# Patient Record
Sex: Female | Born: 2000 | Race: White | Hispanic: No | Marital: Single | Smoking: Never smoker
Health system: Southern US, Community
[De-identification: ages and names within clinical notes are randomized; demographics above are authoritative.]

## PROBLEM LIST (undated history)

## (undated) DIAGNOSIS — J351 Hypertrophy of tonsils: Secondary | ICD-10-CM

## (undated) DIAGNOSIS — B070 Plantar wart: Secondary | ICD-10-CM

## (undated) DIAGNOSIS — R51 Headache: Secondary | ICD-10-CM

## (undated) HISTORY — DX: Hypertrophy of tonsils: J35.1

## (undated) HISTORY — DX: Headache: R51

## (undated) HISTORY — DX: Plantar wart: B07.0

---
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
## 2019-01-04 DIAGNOSIS — L700 Acne vulgaris: Secondary | ICD-10-CM | POA: Diagnosis not present

## 2019-01-04 DIAGNOSIS — Z79899 Other long term (current) drug therapy: Secondary | ICD-10-CM | POA: Diagnosis not present

## 2019-02-02 DIAGNOSIS — Z79899 Other long term (current) drug therapy: Secondary | ICD-10-CM | POA: Diagnosis not present

## 2019-02-02 DIAGNOSIS — L700 Acne vulgaris: Secondary | ICD-10-CM | POA: Diagnosis not present

## 2019-03-03 DIAGNOSIS — L700 Acne vulgaris: Secondary | ICD-10-CM | POA: Diagnosis not present

## 2019-03-03 DIAGNOSIS — Z79899 Other long term (current) drug therapy: Secondary | ICD-10-CM | POA: Diagnosis not present

## 2019-04-01 DIAGNOSIS — Z79899 Other long term (current) drug therapy: Secondary | ICD-10-CM | POA: Diagnosis not present

## 2019-04-01 DIAGNOSIS — L700 Acne vulgaris: Secondary | ICD-10-CM | POA: Diagnosis not present

## 2019-04-27 ENCOUNTER — Other Ambulatory Visit: Payer: Self-pay

## 2019-04-27 ENCOUNTER — Ambulatory Visit (INDEPENDENT_AMBULATORY_CARE_PROVIDER_SITE_OTHER): Payer: Medicaid Other | Admitting: Family Medicine

## 2019-04-27 ENCOUNTER — Encounter: Payer: Self-pay | Admitting: Family Medicine

## 2019-04-27 VITALS — BP 112/72 | HR 84

## 2019-04-27 DIAGNOSIS — Z021 Encounter for pre-employment examination: Secondary | ICD-10-CM | POA: Diagnosis not present

## 2019-04-27 NOTE — Progress Notes (Signed)
Subjective  Jasmine Kennedy is a 18 y.o. 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   No chronic illnesses other than acne on accutane from [facility] Walks for exercise and stress reduction Try to watch diet  No smoking or illicit drugs Not sex active Fhx - Type 2 diabetes   Chief Complaint noted Review of Symptoms - see HPI PMH - Smoking status noted.    Objective Vital Signs reviewed BP 112/72   Pulse 84   LMP 04/25/2019   SpO2 99%  Psych:  Cognition and judgment appear intact. Alert, communicative  and cooperative with normal attention span and concentration. No apparent delusions, illusions, hallucinations Heart - Regular rate and rhythm.  No murmurs, gallops or rubs.    Lungs:  Normal respiratory effort, chest expands symmetrically. Lungs are clear to auscultation, no crackles or wheezes. Extremities:  No cyanosis, edema, or deformity noted with good range of motion of all major joints.   Neck:  No deformities, thyromegaly, masses, or tenderness noted.   Supple with full range of motion without pain.  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

## 2019-05-04 DIAGNOSIS — Z79899 Other long term (current) drug therapy: Secondary | ICD-10-CM | POA: Diagnosis not present

## 2019-05-04 DIAGNOSIS — L700 Acne vulgaris: Secondary | ICD-10-CM | POA: Diagnosis not present

## 2019-05-25 ENCOUNTER — Encounter: Payer: Self-pay | Admitting: Family Medicine

## 2019-05-25 ENCOUNTER — Ambulatory Visit (INDEPENDENT_AMBULATORY_CARE_PROVIDER_SITE_OTHER): Payer: Medicaid Other | Admitting: Family Medicine

## 2019-05-25 ENCOUNTER — Other Ambulatory Visit: Payer: Self-pay

## 2019-05-25 VITALS — BP 118/76 | HR 106

## 2019-05-25 DIAGNOSIS — Z309 Encounter for contraceptive management, unspecified: Secondary | ICD-10-CM

## 2019-05-25 DIAGNOSIS — L700 Acne vulgaris: Secondary | ICD-10-CM | POA: Diagnosis not present

## 2019-05-25 LAB — POCT URINE PREGNANCY: Preg Test, Ur: NEGATIVE

## 2019-05-25 MED ORDER — LEVONORGEST-ETH ESTRAD 91-DAY 0.15-0.03 MG PO TABS: 1.0000 | ORAL_TABLET | Freq: Every day | ORAL | 4 refills | Status: DC

## 2019-05-25 NOTE — Progress Notes (Signed)
   Subjective:    Jasmine Kennedy - 18 y.o. female MRN [id number]  Date of birth: Sep 14, 2001  CC:  Jasmine Kennedy is here for possibly starting oral contraceptives for acne treatment.  HPI: Ms. Kumagai currently takes Accutane for her acne, which she says has caused an improvement in her acne, although she still continues to have a few breakouts on her cheeks.  She thinks that these may also be due to having to wear a mask frequently.  She notes that she will often get new breakouts around her menstrual period.  She is interested in starting combined oral contraceptives to help control her acne.  She is not sexually active, but she likes the idea of being able to prevent pregnancy currently and in the future.  She has been on oral contraceptives once in the past and had a "knot" in her breast which was painful and subsided when she stopped taking the medicine.  She does not know of any family history of clotting disorders and does not smoke.  Health Maintenance:  Health Maintenance Due  Topic Date Due  . HIV Screening  09/06/2015    -  reports that she is a non-smoker but has been exposed to tobacco smoke. She has never used smokeless tobacco. - Review of Systems: Per HPI. - Past Medical History: Patient Active Problem List   Diagnosis Date Noted  . Acne 06/16/2018   - Medications: reviewed and updated   Objective:   Physical Exam BP 118/76   Pulse (!) 106   LMP 04/20/2019 (Approximate)   SpO2 98%  Gen: NAD, alert, cooperative with exam, well-appearing HEENT: NCAT, clear conjunctiva, supple neck Skin: normal turgor, erythematous papules on patient's bilateral cheeks with associated scarring Neuro: no gross deficits.  Psych: good insight, alert and oriented        Assessment & Plan:   Acne Pregnancy test is negative today.  Discussed the mechanism of action of birth control pills and their various benefits.  Also discussed side effects, including nausea, breast tenderness,  development of cysts in the breast like she had before, and increased risk of clotting.  Since she does not have any family history of clots and does not smoke, her wrists are lower.  Discussed that these may also help reduce her periods and we can start a formulation that will reduce the number of.  She has per year to 4 if she is interested.  She is interested in this option, so we will prescribe generic Setlakin combined oral contraceptives.  She understands the importance of taking these at around the same time every day, especially if she become sexually active.  Answered all patient's questions regarding oral contraceptives and encouraged her to call if she has any issues obtaining or tolerating this medication.    Maia Breslow, M.D. 05/25/2019, 3:36 PM PGY-2, [place]

## 2019-05-25 NOTE — Assessment & Plan Note (Signed)
Pregnancy test is negative today.  Discussed the mechanism of action of birth control pills and their various benefits.  Also discussed side effects, including nausea, breast tenderness, development of cysts in the breast like she had before, and increased risk of clotting.  Since she does not have any family history of clots and does not smoke, her wrists are lower.  Discussed that these may also help reduce her periods and we can start a formulation that will reduce the number of.  She has per year to 4 if she is interested.  She is interested in this option, so we will prescribe generic Setlakin combined oral contraceptives.  She understands the importance of taking these at around the same time every day, especially if she become sexually active.  Answered all patient's questions regarding oral contraceptives and encouraged her to call if she has any issues obtaining or tolerating this medication.

## 2019-06-04 DIAGNOSIS — L700 Acne vulgaris: Secondary | ICD-10-CM | POA: Diagnosis not present

## 2019-06-04 DIAGNOSIS — Z79899 Other long term (current) drug therapy: Secondary | ICD-10-CM | POA: Diagnosis not present

## 2019-08-12 DIAGNOSIS — Z79899 Other long term (current) drug therapy: Secondary | ICD-10-CM | POA: Diagnosis not present

## 2019-08-12 DIAGNOSIS — L700 Acne vulgaris: Secondary | ICD-10-CM | POA: Diagnosis not present

## 2019-09-13 DIAGNOSIS — Z79899 Other long term (current) drug therapy: Secondary | ICD-10-CM | POA: Diagnosis not present

## 2019-12-29 ENCOUNTER — Emergency Department (HOSPITAL_COMMUNITY): Payer: Medicaid Other

## 2019-12-29 ENCOUNTER — Other Ambulatory Visit: Payer: Self-pay

## 2019-12-29 ENCOUNTER — Emergency Department (HOSPITAL_COMMUNITY)
Admission: EM | Admit: 2019-12-29 | Discharge: 2019-12-30 | Disposition: A | Discharge status: Home / Self Care | Payer: Medicaid Other | Attending: Emergency Medicine | Admitting: Emergency Medicine

## 2019-12-29 ENCOUNTER — Encounter (HOSPITAL_COMMUNITY): Payer: Self-pay | Admitting: Emergency Medicine

## 2019-12-29 DIAGNOSIS — R109 Unspecified abdominal pain: Secondary | ICD-10-CM | POA: Diagnosis not present

## 2019-12-29 DIAGNOSIS — N83291 Other ovarian cyst, right side: Secondary | ICD-10-CM | POA: Diagnosis not present

## 2019-12-29 DIAGNOSIS — R102 Pelvic and perineal pain: Secondary | ICD-10-CM

## 2019-12-29 DIAGNOSIS — Z79899 Other long term (current) drug therapy: Secondary | ICD-10-CM | POA: Diagnosis not present

## 2019-12-29 DIAGNOSIS — N83201 Unspecified ovarian cyst, right side: Secondary | ICD-10-CM

## 2019-12-29 DIAGNOSIS — Z7722 Contact with and (suspected) exposure to environmental tobacco smoke (acute) (chronic): Secondary | ICD-10-CM | POA: Diagnosis not present

## 2019-12-29 LAB — URINALYSIS, ROUTINE W REFLEX MICROSCOPIC
Bilirubin Urine: NEGATIVE
Glucose, UA: NEGATIVE mg/dL
Hgb urine dipstick: NEGATIVE
Ketones, ur: NEGATIVE mg/dL
Leukocytes,Ua: NEGATIVE
Nitrite: NEGATIVE
Protein, ur: NEGATIVE mg/dL
Specific Gravity, Urine: 1.026 (ref 1.005–1.030)
pH: 6 (ref 5.0–8.0)

## 2019-12-29 LAB — COMPREHENSIVE METABOLIC PANEL
ALT: 25 U/L (ref 0–44)
AST: 18 U/L (ref 15–41)
Albumin: 4.2 g/dL (ref 3.5–5.0)
Alkaline Phosphatase: 99 U/L (ref 38–126)
Anion gap: 8 (ref 5–15)
BUN: 11 mg/dL (ref 6–20)
CO2: 26 mmol/L (ref 22–32)
Calcium: 9.5 mg/dL (ref 8.9–10.3)
Chloride: 105 mmol/L (ref 98–111)
Creatinine, Ser: 0.68 mg/dL (ref 0.44–1.00)
GFR calc Af Amer: >60 mL/min (ref >60)
GFR calc non Af Amer: >60 mL/min (ref >60)
Glucose, Bld: 101 mg/dL — ABNORMAL HIGH (ref 70–99)
Potassium: 4 mmol/L (ref 3.5–5.1)
Sodium: 139 mmol/L (ref 135–145)
Total Bilirubin: 0.2 mg/dL — ABNORMAL LOW (ref 0.3–1.2)
Total Protein: 7.9 g/dL (ref 6.5–8.1)

## 2019-12-29 LAB — CBC
HCT: 40.7 % (ref 36.0–46.0)
Hemoglobin: 13 g/dL (ref 12.0–15.0)
MCH: 28.1 pg (ref 26.0–34.0)
MCHC: 31.9 g/dL (ref 30.0–36.0)
MCV: 88.1 fL (ref 80.0–100.0)
Platelets: 296 10*3/uL (ref 150–400)
RBC: 4.62 MIL/uL (ref 3.87–5.11)
RDW: 13 % (ref 11.5–15.5)
WBC: 14.8 10*3/uL — ABNORMAL HIGH (ref 4.0–10.5)
nRBC: 0 % (ref 0.0–0.2)

## 2019-12-29 LAB — I-STAT BETA HCG BLOOD, ED (MC, WL, AP ONLY): I-stat hCG, quantitative: <5 m[IU]/mL (ref <5)

## 2019-12-29 LAB — LIPASE, BLOOD: Lipase: 26 U/L (ref 11–51)

## 2019-12-29 MED ORDER — IOHEXOL 300 MG/ML  SOLN
100.0000 mL | Freq: Once | INTRAMUSCULAR | Status: AC | PRN
  Administered 2019-12-29: 21:00:00 100 mL via INTRAVENOUS

## 2019-12-29 MED ORDER — MORPHINE SULFATE (PF) 4 MG/ML IV SOLN
4.0000 mg | Freq: Once | INTRAVENOUS | Status: AC
  Administered 2019-12-29: 4 mg via INTRAVENOUS
  Filled 2019-12-29: qty 1

## 2019-12-29 MED ORDER — SODIUM CHLORIDE 0.9 % IV BOLUS
1000.0000 mL | Freq: Once | INTRAVENOUS | Status: AC
  Administered 2019-12-29: 21:00:00 1000 mL via INTRAVENOUS

## 2019-12-29 MED ORDER — SODIUM CHLORIDE (PF) 0.9 % IJ SOLN
INTRAMUSCULAR | Status: AC
  Filled 2019-12-29: qty 50

## 2019-12-29 NOTE — ED Triage Notes (Signed)
Complains of abd pain since this morning, states it feels like period cramps but she had her period two weeks ago. Denies vomiting or diarrhea, endorses nausea, afebrile.

## 2019-12-29 NOTE — ED Provider Notes (Addendum)
[facility]-EMERGENCY DEPT Provider Note   CSN: [id number] Arrival date & time: 12/29/19  1811     History No chief complaint on file.   Jasmine Kennedy is a 19 y.o. female.  Patient is a 18 year old female with no past medical or surgical history.  She presents today for evaluation of lower abdominal pain.  This began earlier this morning and has gradually worsened throughout the day.  She describes pain to the suprapubic region and right lower quadrant.  It is worse when she walks and moves.  She denies any bowel or bladder complaints.  Her last menstrual period was 2 weeks ago and normal.  She denies any vaginal bleeding or discharge.  Patient denies being sexually active.  The history is provided by the patient.       Past Medical History:  Diagnosis Date  . Enlarged tonsils 10/19/2012  . Plantar wart 12/04/2010   Qualifier: Diagnosis of  By: Clotilde Dieter MD, Amber    . Recurrent headache 09/16/2016    Patient Active Problem List   Diagnosis Date Noted  . Acne 06/16/2018    History reviewed. No pertinent surgical history.   OB History   No obstetric history on file.     Family History  Problem Relation Age of Onset  . Diabetes Mellitus II Mother   . Hypertension Father   . Diabetes type I Maternal Grandmother   . Diabetes type I Maternal Grandfather     Social History   Tobacco Use  . Smoking status: Passive Smoke Exposure - Never Smoker  . Smokeless tobacco: Never Used  . Tobacco comment: mother smokes outside  Substance Use Topics  . Alcohol use: Not Currently    Comment: tried once  . Drug use: Never    Home Medications Prior to Admission medications   Medication Sig Start Date End Date Taking? Authorizing Provider  levonorgestrel-ethinyl estradiol (SETLAKIN) 0.15-0.03 MG tablet Take 1 tablet by mouth daily. Patient not taking: Reported on 12/29/2019 05/25/19   Lennox Solders, MD    Allergies    Patient has no known  allergies.  Review of Systems   Review of Systems  All other systems reviewed and are negative.   Physical Exam Updated Vital Signs BP (!) 150/83 (BP Location: Left Arm)   Pulse 82   Temp 98.3 F (36.8 C) (Oral)   Resp 19   Ht 5\' 8"  (1.727 m)   Wt 90.7 kg   LMP 12/13/2019   SpO2 100%   BMI 30.41 kg/m   Physical Exam Vitals and nursing note reviewed.  Constitutional:      General: She is not in acute distress.    Appearance: She is well-developed. She is not diaphoretic.  HENT:     Head: Normocephalic and atraumatic.  Cardiovascular:     Rate and Rhythm: Normal rate and regular rhythm.     Heart sounds: No murmur. No friction rub. No gallop.   Pulmonary:     Effort: Pulmonary effort is normal. No respiratory distress.     Breath sounds: Normal breath sounds. No wheezing.  Abdominal:     General: Bowel sounds are normal. There is no distension.     Palpations: Abdomen is soft.     Tenderness: There is abdominal tenderness. There is no guarding or rebound.     Comments: There is tenderness to palpation in the right lower quadrant and suprapubic region.  Genitourinary:    General: Normal vulva.     Vagina:  Vaginal discharge present.     Comments: There is a slight whitish discharge present.  There is no cervical motion tenderness.  There is some adnexal tenderness to the right, however no palpable masses. Musculoskeletal:        General: Normal range of motion.     Cervical back: Normal range of motion and neck supple.  Skin:    General: Skin is warm and dry.  Neurological:     Mental Status: She is alert and oriented to person, place, and time.     ED Results / Procedures / Treatments   Labs (all labs ordered are listed, but only abnormal results are displayed) Labs Reviewed  COMPREHENSIVE METABOLIC PANEL - Abnormal; Notable for the following components:      Result Value   Glucose, Bld 101 (*)    Total Bilirubin 0.2 (*)    All other components within normal  limits  CBC - Abnormal; Notable for the following components:   WBC 14.8 (*)    All other components within normal limits  URINALYSIS, ROUTINE W REFLEX MICROSCOPIC - Abnormal; Notable for the following components:   APPearance HAZY (*)    All other components within normal limits  LIPASE, BLOOD  I-STAT BETA HCG BLOOD, ED ([facility], [facility], [facility] ONLY)    EKG None  Radiology No results found.  Procedures Procedures (including critical care time)  Medications Ordered in ED Medications  sodium chloride 0.9 % bolus 1,000 mL (has no administration in time range)    ED Course  I have reviewed the triage vital signs and the nursing notes.  Pertinent labs & imaging results that were available during my care of the patient were reviewed by me and considered in my medical decision making (see chart for details).    MDM Rules/Calculators/A&P  Patient presenting here with complaints of suprapubic pain that started earlier today.  She denies any bowel or urinary complaints.  She also denies any vaginal discharge or bleeding.  She states she has not been sexually active in nearly 1 year.  Work-up thus far shows a white count of 15,000, however CT scan shows no evidence for appendicitis or other acute intra-abdominal pathology.  When I returned to discuss the results of the scan with the patient, she was complaining of worsening pain.  She will be given pain medication and will undergo an ultrasound to rule out the possibility of torsion or ovarian cyst.  Care to be signed out to the oncoming provider at shift change.  Dr. Leonette Monarch will obtain the results of the ultrasound and determine the final disposition.  Ultrasound has been obtained and shows no evidence for torsion, but does reveal a right-sided ovarian cyst.  Wet prep and GC/committee are pending.  Final Clinical Impression(s) / ED Diagnoses Final diagnoses:  None    Rx / DC Orders ED Discharge Orders    None       Veryl Speak,  MD 12/29/19 0973    Veryl Speak, MD 12/30/19 [phone]

## 2019-12-30 DIAGNOSIS — N83291 Other ovarian cyst, right side: Secondary | ICD-10-CM | POA: Diagnosis not present

## 2019-12-30 LAB — WET PREP, GENITAL
Clue Cells Wet Prep HPF POC: NONE SEEN
Sperm: NONE SEEN
Trich, Wet Prep: NONE SEEN
Yeast Wet Prep HPF POC: NONE SEEN

## 2019-12-30 MED ORDER — TRAMADOL HCL 50 MG PO TABS: 50.0000 mg | ORAL_TABLET | Freq: Four times a day (QID) | ORAL | 0 refills | Status: DC | PRN

## 2019-12-30 NOTE — ED Notes (Signed)
PT A/ox4 and ambulatory at discharge

## 2019-12-30 NOTE — ED Provider Notes (Signed)
I assumed care of this patient from Dr. Judd Lien.  Please see their note for further details of Hx, PE.  Briefly patient is a 19 y.o. female who presented RLQ pain pending [place] and Wet prep. Rest of work up reassuring.  [place] with cyst. No torsion. Wet prep negative.  The patient appears reasonably screened and/or stabilized for discharge and I doubt any other medical condition or other [facility] requiring further screening, evaluation, or treatment in the ED at this time prior to discharge.  The patient is safe for discharge with strict return precautions.   The patient appears reasonably screened and/or stabilized for discharge and I doubt any other medical condition or other [facility] requiring further screening, evaluation, or treatment in the ED at this time prior to discharge.  Disposition: Discharge  Condition: Good  I have discussed the results, Dx and Tx plan with the patient who expressed understanding and agree(s) with the plan. Discharge instructions discussed at great length. The patient was given strict return precautions who verbalized understanding of the instructions. No further questions at time of discharge.      Follow Up: Primary care provider  Schedule an appointment as soon as possible for a visit            Abron Neddo, Amadeo Garnet, MD 12/30/19 [phone]

## 2019-12-30 NOTE — Discharge Instructions (Addendum)
Tramadol as prescribed as needed for pain.  Follow-up with your primary doctor if symptoms or not improving in the next 3 to 4 days, and return to the ER if symptoms significantly worsen or change.

## 2019-12-31 LAB — GC/CHLAMYDIA PROBE AMP (~~LOC~~) NOT AT ARMC
Chlamydia: NEGATIVE
Neisseria Gonorrhea: NEGATIVE

## 2020-01-03 ENCOUNTER — Other Ambulatory Visit: Payer: Self-pay | Admitting: Family Medicine

## 2020-01-03 ENCOUNTER — Telehealth: Payer: Self-pay | Admitting: Family Medicine

## 2020-01-03 DIAGNOSIS — N83201 Unspecified ovarian cyst, right side: Secondary | ICD-10-CM

## 2020-01-03 NOTE — Telephone Encounter (Signed)
Pt informed. Anila Bojarski T Oron Westrup, CMA  

## 2020-01-03 NOTE — Telephone Encounter (Signed)
Pt needs a referral to [facility] DR. Huel Cote. She has a cyst. jw

## 2020-01-03 NOTE — Telephone Encounter (Signed)
Referral placed. Thank you

## 2020-01-19 DIAGNOSIS — R1031 Right lower quadrant pain: Secondary | ICD-10-CM | POA: Diagnosis not present

## 2020-04-12 ENCOUNTER — Ambulatory Visit: Payer: Medicaid Other | Admitting: Family Medicine

## 2020-08-02 DIAGNOSIS — Z3041 Encounter for surveillance of contraceptive pills: Secondary | ICD-10-CM | POA: Diagnosis not present

## 2020-08-02 DIAGNOSIS — R519 Headache, unspecified: Secondary | ICD-10-CM | POA: Diagnosis not present

## 2020-12-15 ENCOUNTER — Other Ambulatory Visit: Payer: Self-pay

## 2020-12-15 ENCOUNTER — Encounter: Payer: Self-pay | Admitting: Family Medicine

## 2020-12-15 ENCOUNTER — Ambulatory Visit (INDEPENDENT_AMBULATORY_CARE_PROVIDER_SITE_OTHER): Payer: Medicaid Other | Admitting: Family Medicine

## 2020-12-15 DIAGNOSIS — F419 Anxiety disorder, unspecified: Secondary | ICD-10-CM | POA: Diagnosis not present

## 2020-12-15 MED ORDER — ESCITALOPRAM OXALATE 10 MG PO TABS: 10.0000 mg | ORAL_TABLET | Freq: Every day | ORAL | 0 refills | Status: DC

## 2020-12-15 NOTE — Progress Notes (Signed)
     SUBJECTIVE:   CHIEF COMPLAINT / HPI:   Jasmine Kennedy is a 20 y.o. female presents with anxiety   Anxiety, low mood Pt reports feeling anxious for the>1 year which has worsened over the last 2 months. It started when her mom started to get sick. She is ESRD and now on  dialysis and had a recent amputation. She is now her mom's primary caregiver and is feeling very overwhelmed. She works part time and is also studying part time at [employer]. Finds herself worrying,rethinking things over again and feeling anxious. Feels less anxious when she is around her friends but the feelings arise again when she is alone. Feels sad sometimes and is often tearful, able to sleep well and enjoys hobbies. Has not previously tried anti-depressants. No manic episodes in the past. Has not had therapy before.  PERTINENT  PMH / PSH: Acne, ovarian cyst   OBJECTIVE:   BP 112/80   Pulse 86   Wt 190 lb 3.2 oz (86.3 kg)   SpO2 98%   BMI 28.92 kg/m    General: Alert, no acute distress, tearful at times, cooperative Cardio: Normal S1 and S2, RRR, no r/m/g Pulm: CTAB, normal work of breathing Abdomen: Bowel sounds normal. Abdomen soft and non-tender.  Extremities: No peripheral edema.  Neuro: Cranial nerves grossly intact   ASSESSMENT/PLAN:   Anxiety Pt likely has an anxiety disorder. PHQ 9 18 and GAD 19. No active or passive SI. Sx of anxiety are more prodominent compared to depressive sx. Pt is happy to start SSRI today. Started Lexapro 10mg  and explained side effects. Also provided pt with suicidal hotline number. She is also open to starting therapy. I called Dr who will call the patient on 24th Jan to arrange therapy. Follow up arranged with me on 9th Feb.      01-21-1996, MD PGY-2 [facility]

## 2020-12-15 NOTE — Patient Instructions (Addendum)
Thank you for coming to see me today. It was a pleasure to talk to you.  I am sorry that you have been feeling very anxious over the last few months. We are starting a medicine called Lexapro.  We can start a low-dose of 10 mg and then increase it to 20 mg in the next few months.  Take this once a day in the morning.  Please see the information leaflet on Lexapro below.  Some of the side effects as we discussed-suicidal thoughts, dizziness, blurred vision, insomnia etc. If you experience side effects please contact me and we can talk things through.  I spoke with Dr. Shawnee Knapp who is the clinical psychologist at our clinic.  She will contact you on Monday for an appointment for therapy.  Please follow-up with me in 3 weeks to see how you are getting on.   National Suicide Prevention Lifeline We can all help prevent suicide. The Lifeline provides 24/7, free and confidential support for people in distress, prevention and crisis resources for you or your loved ones, and best practices for professionals in the [place].  ([phone]   If you have any questions or concerns, please do not hesitate to call the office at [phone]. Best wishes,    Dr Allena Katz                 Escitalopram Tablets What is this medicine? ESCITALOPRAM (es sye TAL oh pram) is used to treat depression and certain types of anxiety. This medicine may be used for other purposes; ask your health care provider or pharmacist if you have questions. COMMON BRAND NAME(S): Lexapro What should I tell my health care provider before I take this medicine? They need to know if you have any of these conditions:  bipolar disorder or a family history of bipolar disorder  diabetes  glaucoma  heart disease  kidney or liver disease  receiving electroconvulsive therapy  seizures (convulsions)  suicidal thoughts, plans, or attempt by you or a family member  an unusual or allergic reaction to escitalopram,  the related drug citalopram, other medicines, foods, dyes, or preservatives  pregnant or trying to become pregnant   breast-feeding How should I use this medicine? Take this medicine by mouth with a glass of water. Follow the directions on the prescription label. You can take it with or without food. If it upsets your stomach, take it with food. Take your medicine at regular intervals. Do not take it more often than directed. Do not stop taking this medicine suddenly except upon the advice of your doctor. Stopping this medicine too quickly may cause serious side effects or your condition may worsen. A special MedGuide will be given to you by the pharmacist with each prescription and refill. Be sure to read this information carefully each time. Talk to your pediatrician regarding the use of this medicine in children. Special care may be needed. Overdosage: If you think you have taken too much of this medicine contact a poison control center or emergency room at once. NOTE: This medicine is only for you. Do not share this medicine with others. What if I miss a dose? If you miss a dose, take it as soon as you can. If it is almost time for your next dose, take only that dose. Do not take double or extra doses. What may interact with this medicine? Do not take this medicine with any of the following medications:  certain medicines for fungal infections like fluconazole, itraconazole,  ketoconazole, posaconazole, voriconazole  cisapride  citalopram  dronedarone  linezolid  MAOIs like Carbex, Eldepryl, Marplan, Nardil, and Parnate  methylene blue (injected into a vein)  pimozide  thioridazine This medicine may also interact with the following medications:  alcohol  amphetamines  aspirin and aspirin-like medicines  carbamazepine  certain medicines for depression, anxiety, or psychotic disturbances  certain medicines for migraine headache like almotriptan, eletriptan, frovatriptan,  naratriptan, rizatriptan, sumatriptan, zolmitriptan  certain medicines for sleep  certain medicines that treat or prevent blood clots like warfarin, enoxaparin, dalteparin  cimetidine  diuretics  dofetilide  fentanyl  furazolidone  isoniazid  lithium  metoprolol  NSAIDs, medicines for pain and inflammation, like ibuprofen or naproxen  other medicines that prolong the QT interval (cause an abnormal heart rhythm)  procarbazine  rasagiline  supplements like St. John's wort, kava kava, valerian  tramadol  tryptophan  ziprasidone This list may not describe all possible interactions. Give your health care provider a list of all the medicines, herbs, non-prescription drugs, or dietary supplements you use. Also tell them if you smoke, drink alcohol, or use illegal drugs. Some items may interact with your medicine. What should I watch for while using this medicine? Tell your doctor if your symptoms do not get better or if they get worse. Visit your doctor or health care professional for regular checks on your progress. Because it may take several weeks to see the full effects of this medicine, it is important to continue your treatment as prescribed by your doctor. Patients and their families should watch out for new or worsening thoughts of suicide or depression. Also watch out for sudden changes in feelings such as feeling anxious, agitated, panicky, irritable, hostile, aggressive, impulsive, severely restless, overly excited and hyperactive, or not being able to sleep. If this happens, especially at the beginning of treatment or after a change in dose, call your health care professional. Bonita Quin may get drowsy or dizzy. Do not drive, use machinery, or do anything that needs mental alertness until you know how this medicine affects you. Do not stand or sit up quickly, especially if you are an older patient. This reduces the risk of dizzy or fainting spells. Alcohol may interfere with  the effect of this medicine. Avoid alcoholic drinks. Your mouth may get dry. Chewing sugarless gum or sucking hard candy, and drinking plenty of water may help. Contact your doctor if the problem does not go away or is severe. What side effects may I notice from receiving this medicine? Side effects that you should report to your doctor or health care professional as soon as possible:  allergic reactions like skin rash, itching or hives, swelling of the face, lips, or tongue  anxious  black, tarry stools  changes in vision  confusion  elevated mood, decreased need for sleep, racing thoughts, impulsive behavior  eye pain  fast, irregular heartbeat  feeling faint or lightheaded, falls  feeling agitated, angry, or irritable  hallucination, loss of contact with reality  loss of balance or coordination  loss of memory  painful or prolonged erections  restlessness, pacing, inability to keep still  seizures  stiff muscles  suicidal thoughts or other mood changes  trouble sleeping  unusual bleeding or bruising  unusually weak or tired  vomiting Side effects that usually do not require medical attention (report to your doctor or health care professional if they continue or are bothersome):  changes in appetite  change in sex drive or performance  headache  increased  sweating  indigestion, nausea  tremors This list may not describe all possible side effects. Call your doctor for medical advice about side effects. You may report side effects to FDA at 1-800-FDA-1088. Where should I keep my medicine? Keep out of reach of children. Store at room temperature between 15 and 30 degrees C (59 and 86 degrees F). Throw away any unused medicine after the expiration date. NOTE: This sheet is a summary. It may not cover all possible information. If you have questions about this medicine, talk to your doctor, pharmacist, or health care provider.  2021 Elsevier/Gold  Standard (2020-10-02 09:53:34)

## 2020-12-17 DIAGNOSIS — F419 Anxiety disorder, unspecified: Secondary | ICD-10-CM | POA: Insufficient documentation

## 2020-12-17 NOTE — Assessment & Plan Note (Addendum)
Pt likely has an anxiety disorder. PHQ 9 18 and GAD 19. No active or passive SI. Sx of anxiety are more prodominent compared to depressive sx. Pt is happy to start SSRI today. Started Lexapro 10mg  and explained side effects. Also provided pt with suicidal hotline number. She is also open to starting therapy. I called Dr who will call the patient on 24th Jan to arrange therapy. Follow up arranged with me on 9th Feb.

## 2020-12-18 ENCOUNTER — Telehealth: Payer: Self-pay | Admitting: Psychology

## 2020-12-18 NOTE — Telephone Encounter (Signed)
Scheduled pt appt for 1/31 at 130pm

## 2020-12-25 ENCOUNTER — Ambulatory Visit (INDEPENDENT_AMBULATORY_CARE_PROVIDER_SITE_OTHER): Payer: Medicaid Other | Admitting: Psychology

## 2020-12-25 ENCOUNTER — Other Ambulatory Visit: Payer: Self-pay

## 2020-12-25 DIAGNOSIS — F419 Anxiety disorder, unspecified: Secondary | ICD-10-CM | POA: Diagnosis not present

## 2020-12-25 NOTE — BH Specialist Note (Signed)
Integrated Behavioral Health Initial In-Person Visit  MRN: [id number] Name: Jasmine Kennedy  Number of Integrated Behavioral Health Clinician visits:: 1/6 Session Start time: 130  Session End time: 2 Total time: 30 minutes  Types of Service: Individual psychotherapy    Subjective: Jasmine Kennedy is a 20 y.o. female  Patient was referred by Dr. Allena Katz for anxiety Patient reports the following symptoms/concerns: Pt reported recent stressors in the past 6 months including her mom's hospitalization and unstable living situation. Pt reported recent break-up. Pt reports anxiety sx such as racing thoughts; feelings of panic and forgetting things.  Pt reports engagement in self-care such as journaling that are helpful.  Asked patient to document anxious thoughts to label CBT cycle.  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 Reduce symptoms of: anxiety: racing thoughts; forgetfulness; sleep issues  2. Increase knowledge and/or ability of: self-management skills: recognizing anxious thoughts to challenge    Progress towards Goals: Ongoing  Interventions: Interventions utilized: CBT Cognitive Behavioral Therapy  Standardized Assessments completed: PHQ 9  Patient and/or Family Response: Pt engaged in treatment planning   Patient Centered Plan: Patient is on the following Treatment Plan(s): anxiety treatment plan   Assessment: Patient currently experiencing anxiety related to life transitions.   Patient may benefit from CBT  Plan: 1. Follow up with behavioral health clinician on : 2 weeks  2. Behavioral recommendations:  writing down anxious thoughts  3. Referral(s): Integrated [employer] (In Clinic)  Royetta Asal, PhD., LMFT-A

## 2021-01-02 NOTE — Progress Notes (Addendum)
     SUBJECTIVE:   CHIEF COMPLAINT / HPI:   Jasmine Kennedy is a 20 y.o. female presents for anxiety follow up  Anxiety Presents for follow up for anxiety.  Seen in clinic on 12/16/20 and treated with Lexapro 10mg  and referred to Dr for therapy.  Since then patient reports improvement in symptoms.. Tolerating medication without side effects. Associated symptoms include eating more and snacking. Recently broke up with her boyfriend, after they broke up she has felt much anxious. PHQ 9 18 and GAD 19 at last visit. App with Dr Shawnee Knapp with 14th.  Concern for pregnancy  Was sexually active with ex-boyfriend. Complaint with OCP, no missed pills. Friend tested positive for pregnancy and wants it tested.  Health maintenance  Covid vaccine: Has had both COVID vaccines, booster pending Flu vaccine: Pending HIV, hep c: never obtained   PERTINENT  PMH / PSH: anxiety, acne   OBJECTIVE:   BP (!) 112/58   Pulse 84   Ht 5\' 7"  (1.702 m)   Wt 188 lb (85.3 kg)   LMP 12/20/2020   SpO2 98%   BMI 29.44 kg/m    General: Alert, no acute distress Cardio: well perfused Pulm: normal work of breathing Neuro: Cranial nerves grossly intact   U preg: negative   ASSESSMENT/PLAN:   Anxiety Pt is doing much better today and in better spirits compared to last visit. PHQ 18, GAD 8 (improved from 16 3 weeks ago). No active or passive SI.  Patient reports anxiety sx are much improved after breaking up with her boyfriend and her appetite is returning to normal. Continue Lexapro 10mg  and therapy with Dr Levy-patient has upcoming appointment on the 14th. Follow up with me in 5 to 6 weeks.  Healthcare maintenance Obtained hepatitis C and HIV labs today.     12/22/2020, MD PGY-2 [facility]

## 2021-01-02 NOTE — Assessment & Plan Note (Signed)
Pt is doing much better today and in better spirits compared to last visit. PHQ 18, GAD 8 (improved from 16 3 weeks ago). No active or passive SI.  Patient reports anxiety sx are much improved after breaking up with her boyfriend and her appetite is returning to normal. Continue Lexapro 10mg  and therapy with Dr Levy-patient has upcoming appointment on the 14th. Follow up with me in 5 to 6 weeks.

## 2021-01-03 ENCOUNTER — Other Ambulatory Visit: Payer: Self-pay

## 2021-01-03 ENCOUNTER — Ambulatory Visit (INDEPENDENT_AMBULATORY_CARE_PROVIDER_SITE_OTHER): Payer: Medicaid Other | Admitting: Family Medicine

## 2021-01-03 ENCOUNTER — Encounter: Payer: Self-pay | Admitting: Family Medicine

## 2021-01-03 VITALS — BP 112/58 | HR 84 | Ht 67.0 in | Wt 188.0 lb

## 2021-01-03 DIAGNOSIS — F419 Anxiety disorder, unspecified: Secondary | ICD-10-CM | POA: Diagnosis not present

## 2021-01-03 DIAGNOSIS — Z113 Encounter for screening for infections with a predominantly sexual mode of transmission: Secondary | ICD-10-CM

## 2021-01-03 DIAGNOSIS — Z3200 Encounter for pregnancy test, result unknown: Secondary | ICD-10-CM | POA: Diagnosis not present

## 2021-01-03 DIAGNOSIS — Z0000 Encounter for general adult medical examination without abnormal findings: Secondary | ICD-10-CM

## 2021-01-03 LAB — POCT URINE PREGNANCY: Preg Test, Ur: NEGATIVE

## 2021-01-03 NOTE — Patient Instructions (Signed)
Thank you for coming to see me today. It was a pleasure. Today we discussed your anxiety. I am very happy that you are doing so much better. Continue on the same dose of lexapro.  Please follow-up with me in 5-6 weeks.   If you have any questions or concerns, please do not hesitate to call the office at [phone].  Best wishes,   Dr Allena Katz

## 2021-01-03 NOTE — Assessment & Plan Note (Signed)
Obtained hepatitis C and HIV labs today.

## 2021-01-04 ENCOUNTER — Encounter: Payer: Self-pay | Admitting: Family Medicine

## 2021-01-04 LAB — HEPATITIS C ANTIBODY: Hep C Virus Ab: <0.1 s/co ratio (ref 0.0–0.9)

## 2021-01-04 LAB — HIV ANTIBODY (ROUTINE TESTING W REFLEX): HIV Screen 4th Generation wRfx: NONREACTIVE

## 2021-01-08 ENCOUNTER — Ambulatory Visit: Payer: Medicaid Other | Admitting: Psychology

## 2021-01-13 ENCOUNTER — Other Ambulatory Visit: Payer: Self-pay | Admitting: Family Medicine

## 2021-01-29 ENCOUNTER — Telehealth: Payer: Self-pay

## 2021-01-29 NOTE — Telephone Encounter (Signed)
Patient calls nurse line regarding side effects from Lexapro. Patient reports exhaustion "all the time", little energy, and not interested in doing things.   Reports that she has been feeling like this for ~1 month. Reports slight improvement in anxiety symptoms. However, in the last few weeks she has noticed an increase in anxiety in the AM.   Patient has questions about if this is the best medication for her.  Patient has follow up appointment on 3/21. Patient is currently not having thoughts of self harm and feels safe.   Patient has contact numbers for suicide hotline and [facility] if needed.   Veronda Prude, RN

## 2021-01-29 NOTE — Telephone Encounter (Signed)
Unlikely that this is due to the Lexapro. Pt should come in earlier than her appointment with me for evaluation for fatigue. She will need lab work. Please could you inform the pt and book a clinic visit. Continue Lexapro until she is seen in the clinic. She should not stop Lexapro abruptly. Thank you.

## 2021-01-31 NOTE — Telephone Encounter (Signed)
LVM to call office and inform her of below, if she calls back please assist her in getting an earlier appointment per Dr. Allena Katz. Jasmine Kennedy, CMA

## 2021-02-11 NOTE — Progress Notes (Addendum)
     SUBJECTIVE:   CHIEF COMPLAINT / HPI:   Jasmine Kennedy is a 20 y.o. female presents for anxiety follow up  Anxiety Pt is on Lexapro 10mg  which was started 2 months ago. Anxiety sx are much less now. Meditation and reading helps with her anxiety.   Fatigue Started on 3 weeks ago. Within 2 weeks of starting medicine she started to feel drowsy and tired. Usually feel tired 30 mins after pills. No missed pills. Worsened by nothing else. Alleviated by nothing.  Pt has heavy periods. LMP 1 month ago, due now. No hx of anemia. Takes med in the morning as directed  Visit from 02/12/2021 in [facility]  PHQ-9 Total Score 18        Health Maintenance Due  Topic  . COVID-19 Vaccine (1)  . HPV VACCINES (1 - 2-dose series)  . TETANUS/TDAP       PERTINENT  PMH / PSH: anxiety, acne   OBJECTIVE:   BP 136/77   Pulse 82   Wt 187 lb 6.4 oz (85 kg)   SpO2 100%   BMI 29.35 kg/m    General: Alert, no acute distress Cardio: well perfused  Pulm: normal work of breathing Neuro: Cranial nerves grossly intact   ASSESSMENT/PLAN:   Fatigue Pt is experiencing new fatigue and drowsiness sx since starting the Lexapro. She usually feels it 30 mins after taking the medicine. It is most likely a medication side effect. No hx of heavy menstrual bleeding, pt is actually on OCP. However will obtain CBC and anemia panel to rule out other causes of fatigue such as anemia. Used shared decision making and gave pt 2 options of taking lexpro at night Vs switching to zoloft today. She will try taking it at night and see if she feels better in the mornings. She will update me via mchart in the next week and let me know if this is not working and I will prescribe the Zoloft 50mg .  Anxiety Pt reports marked improvement in anxiety sx. Continue Lexapro 10mg  but to be taken at night due to fatigue and drowsiness as side effects.  Healthcare maintenance Obtained A1c  today per pt's request.     [place], MD PGY-2 [facility]

## 2021-02-12 ENCOUNTER — Ambulatory Visit (INDEPENDENT_AMBULATORY_CARE_PROVIDER_SITE_OTHER): Payer: Medicaid Other | Admitting: Family Medicine

## 2021-02-12 ENCOUNTER — Encounter: Payer: Self-pay | Admitting: Family Medicine

## 2021-02-12 ENCOUNTER — Other Ambulatory Visit: Payer: Self-pay

## 2021-02-12 VITALS — BP 136/77 | HR 82 | Wt 187.4 lb

## 2021-02-12 DIAGNOSIS — R5383 Other fatigue: Secondary | ICD-10-CM | POA: Diagnosis not present

## 2021-02-12 DIAGNOSIS — F419 Anxiety disorder, unspecified: Secondary | ICD-10-CM

## 2021-02-12 DIAGNOSIS — Z0000 Encounter for general adult medical examination without abnormal findings: Secondary | ICD-10-CM | POA: Diagnosis not present

## 2021-02-12 LAB — POCT GLYCOSYLATED HEMOGLOBIN (HGB A1C): HbA1c, POC (controlled diabetic range): 5.1 % (ref 0.0–7.0)

## 2021-02-12 NOTE — Patient Instructions (Signed)
Thank you for coming to see me today. It was a pleasure. Today we discussed your tiredness and drowsiness. I recommend taking the lexapro at night and see if this helps with your symptoms.  Please give me an update via Mychart in the next week and if it is not helping then we can switch you to zoloft 50mg . (same family of drugs as lexapro).  We will get some labs today.  If they are abnormal or we need to do something about them, I will call you.  If they are normal, I will send you a message on MyChart (if it is active) or a letter in the mail.  If you don't hear from in 2 weeks, please call the office at the number below.  Please follow-up with me in 2-4 weeks.  If you have any questions or concerns, please do not hesitate to call the office at [phone].  Best wishes,   Dr [phone]

## 2021-02-12 NOTE — Assessment & Plan Note (Signed)
Obtained A1c today per pt's request.

## 2021-02-12 NOTE — Assessment & Plan Note (Signed)
Pt is experiencing new fatigue and drowsiness sx since starting the Lexapro. She usually feels it 30 mins after taking the medicine. It is most likely a medication side effect. No hx of heavy menstrual bleeding, pt is actually on OCP. However will obtain CBC and anemia panel to rule out other causes of fatigue such as anemia. Used shared decision making and gave pt 2 options of taking lexpro at night Vs switching to zoloft today. She will try taking it at night and see if she feels better in the mornings. She will update me via mchart in the next week and let me know if this is not working and I will prescribe the Zoloft 50mg .

## 2021-02-12 NOTE — Assessment & Plan Note (Signed)
Pt reports marked improvement in anxiety sx. Continue Lexapro 10mg  but to be taken at night due to fatigue and drowsiness as side effects.

## 2021-02-13 LAB — ANEMIA PANEL
Ferritin: 59 ng/mL (ref 15–150)
Folate, Hemolysate: 394 ng/mL
Folate, RBC: 985 ng/mL (ref >498)
Hematocrit: 40 % (ref 34.0–46.6)
Iron Saturation: 31 % (ref 15–55)
Iron: 123 ug/dL (ref 27–159)
Retic Ct Pct: 1.1 % (ref 0.6–2.6)
Total Iron Binding Capacity: 397 ug/dL (ref 250–450)
UIBC: 274 ug/dL (ref 131–425)
Vitamin B-12: 449 pg/mL (ref 232–1245)

## 2021-02-13 LAB — CBC
Hemoglobin: 13.4 g/dL (ref 11.1–15.9)
MCH: 28.9 pg (ref 26.6–33.0)
MCHC: 33.5 g/dL (ref 31.5–35.7)
MCV: 86 fL (ref 79–97)
Platelets: 276 10*3/uL (ref 150–450)
RBC: 4.63 x10E6/uL (ref 3.77–5.28)
RDW: 12.1 % (ref 11.7–15.4)
WBC: 5.9 10*3/uL (ref 3.4–10.8)

## 2021-02-14 ENCOUNTER — Encounter: Payer: Self-pay | Admitting: Family Medicine

## 2021-02-15 MED ORDER — ESCITALOPRAM OXALATE 10 MG PO TABS: 10.0000 mg | ORAL_TABLET | Freq: Every day | ORAL | 0 refills | Status: DC

## 2021-03-16 ENCOUNTER — Other Ambulatory Visit: Payer: Self-pay | Admitting: Family Medicine

## 2021-04-12 ENCOUNTER — Other Ambulatory Visit: Payer: Self-pay | Admitting: Family Medicine

## 2021-06-01 ENCOUNTER — Other Ambulatory Visit: Payer: Self-pay | Admitting: Family Medicine

## 2021-07-03 ENCOUNTER — Encounter: Payer: Self-pay | Admitting: Family Medicine

## 2021-07-03 ENCOUNTER — Ambulatory Visit (INDEPENDENT_AMBULATORY_CARE_PROVIDER_SITE_OTHER): Payer: Medicaid Other | Admitting: Family Medicine

## 2021-07-03 ENCOUNTER — Other Ambulatory Visit: Payer: Self-pay

## 2021-07-03 VITALS — BP 122/68 | HR 80 | Ht 67.0 in | Wt 192.0 lb

## 2021-07-03 DIAGNOSIS — N926 Irregular menstruation, unspecified: Secondary | ICD-10-CM | POA: Diagnosis not present

## 2021-07-03 DIAGNOSIS — F419 Anxiety disorder, unspecified: Secondary | ICD-10-CM

## 2021-07-03 DIAGNOSIS — Z0000 Encounter for general adult medical examination without abnormal findings: Secondary | ICD-10-CM | POA: Diagnosis not present

## 2021-07-03 LAB — POCT URINE PREGNANCY: Preg Test, Ur: NEGATIVE

## 2021-07-03 MED ORDER — HYDROXYZINE HCL 10 MG PO TABS: 10.0000 mg | ORAL_TABLET | Freq: Three times a day (TID) | ORAL | 0 refills | Status: DC | PRN

## 2021-07-03 NOTE — Progress Notes (Addendum)
     SUBJECTIVE:   CHIEF COMPLAINT / HPI:   Jasmine Kennedy is a 20 y.o. female presents for anxiety follow up  Wants a pregnancy test. Sexually active with different female since last visit. Birth control: OCP. No missed pills but paranoid that she is pregnant. Period is due this week. LMP was 1 month ago.   Anxiety  Experiencing side effects from Lexapro: grogginess, numbness, tiredness. She stopped taking it 2 weeks. Denies suicidal ideation, low mood or worsening of anxiety. Anxiety has got better with self care and reading.   GAD 7 : Generalized Anxiety Score 07/03/2021 02/12/2021 01/03/2021  Nervous, Anxious, on Edge 1 0 1  Control/stop worrying 2 1 1   Worry too much - different things 2 1 1   Trouble relaxing 2 3 2   Restless 3 3 1   Easily annoyed or irritable 1 3 1   Afraid - awful might happen 3 0 1  Total GAD 7 Score 14 11 8   Anxiety Difficulty Somewhat difficult - -      Flowsheet Row Office Visit from 07/03/2021 in [facility]  PHQ-9 Total Score 8        PERTINENT  PMH / PSH: anxiety   OBJECTIVE:   BP 122/68   Pulse 80   Ht 5\' 7"  (1.702 m)   Wt 192 lb (87.1 kg)   LMP 06/01/2021   BMI 30.07 kg/m    General: Alert, no acute distress Cardio: well perfused  Pulm: normal work of breathing  Neuro: Cranial nerves grossly intact   ASSESSMENT/PLAN:   Anxiety GAD 7 14 today, worsened from 11 at previous visit although pt does report overall improvement in anxiety with reading and self care habits. She came off the Lexapro herself. Discussed starting hydroxyzine PRN for anxiety which is something she would like to try. Prescribed 10mg  Hydroxyzine up to 3 times a day. Side effects discussed. Continue to encourage therapy. Follow up as needed.   Healthcare maintenance U preg negative.     , MD PGY-3 [facility]

## 2021-07-03 NOTE — Patient Instructions (Signed)
Thank you for coming to see me today. It was a pleasure. Today we discussed your anxiety. I recommend: stopped lexapro, starting HYDROXYZINE for anxiety as needed. Can take it up to 3 times a day. Continue your self care routine. Would recommend therapy as needed.   Please follow-up with me as and when you need.   If you have any questions or concerns, please do not hesitate to call the office at [phone].  Best wishes,   Dr Allena Katz

## 2021-07-04 NOTE — Assessment & Plan Note (Signed)
GAD 7 14 today, worsened from 11 at previous visit although pt does report overall improvement in anxiety with reading and self care habits. She came off the Lexapro herself. Discussed starting hydroxyzine PRN for anxiety which is something she would like to try. Prescribed 10mg  Hydroxyzine up to 3 times a day. Side effects discussed. Continue to encourage therapy. Follow up as needed.

## 2021-07-04 NOTE — Assessment & Plan Note (Signed)
Upreg negative.

## 2021-07-19 DIAGNOSIS — Z30430 Encounter for insertion of intrauterine contraceptive device: Secondary | ICD-10-CM | POA: Diagnosis not present

## 2021-07-19 DIAGNOSIS — Z939 Artificial opening status, unspecified: Secondary | ICD-10-CM | POA: Diagnosis not present

## 2021-08-11 ENCOUNTER — Other Ambulatory Visit: Payer: Self-pay | Admitting: Family Medicine

## 2021-10-30 ENCOUNTER — Ambulatory Visit: Payer: Medicaid Other | Admitting: Student

## 2021-11-05 ENCOUNTER — Other Ambulatory Visit: Payer: Self-pay

## 2021-11-05 ENCOUNTER — Ambulatory Visit: Payer: 59 | Admitting: Family Medicine

## 2021-11-05 ENCOUNTER — Encounter: Payer: Self-pay | Admitting: Family Medicine

## 2021-11-05 VITALS — BP 124/87 | HR 101 | Ht 67.0 in | Wt 195.0 lb

## 2021-11-05 DIAGNOSIS — Z6830 Body mass index (BMI) 30.0-30.9, adult: Secondary | ICD-10-CM

## 2021-11-05 DIAGNOSIS — R4586 Emotional lability: Secondary | ICD-10-CM | POA: Insufficient documentation

## 2021-11-05 NOTE — Progress Notes (Signed)
    SUBJECTIVE:   CHIEF COMPLAINT / HPI:   Patient presents for interest in weight loss. Having trouble losing weight and struggling to do so for years in spite of eating healthy and exercising. Wanting to be on a medication and has heard from a friend that it has helped her with weight loss and her self-confidence so she is very much interested in this for weight management. Typical diet consists of oatmeal in the morning, [place], chicken and beans for protein, yogurt, fruits and salads with vegetables. Drinks water most often, occasionally tea. Exercises regularly, does the treadmill, stretches, cardio, resistance and weight lifting. She does this at least 3-4 times a week. 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    PERTINENT  PMH / PSH:   Mood changes Has ongoing family and school stressors but nothing new. Feels that a lot of her self worth is attached to her goal of losing weight. Support system includes mother. Was seeing a therapist but not anymore due to lack of time but is interested in seeing one regularly. Compliant on hydroxyzine, takes it as needed which she has needed to take it 0-1 times in a day over the past month but seems like she needs it 3 times at day on some days.   OBJECTIVE:   BP 124/87   Pulse (!) 101   Ht 5\' 7"  (1.702 m)   Wt 195 lb (88.5 kg)   LMP 07/09/2021 Comment: received IUD after this  BMI 30.54 kg/m   General: Patient well-appearing, in no acute distress. CV: RRR, no murmurs or gallops auscultated Resp: CTAB Ext: radial pulses strong and equal bilaterally Psych: mood appropriate but tearful at times, pleasant   ASSESSMENT/PLAN:   BMI 30.0-30.9,adult -extensive lifestyle modifications discussed and reassurance provided -nutrition handout provided  -referral placed to healthy weight and wellness -follow up in 1 month, consider starting ozempic for weight loss   Mood changes -reassurance provided, much of patient's  perspective of self is linked to weight loss, discussed the health benefits of weight loss -list of therapists provided, encouraged to choose one and establish care at earliest convenience -PHQ-9 score of 13 with negative question 9 reviewed and discussed  -follow up in 1 month for mood check    Jasmine Kennedy 05-25-1998, DO [facility]

## 2021-11-05 NOTE — Assessment & Plan Note (Signed)
-  extensive lifestyle modifications discussed and reassurance provided -nutrition handout provided  -referral placed to healthy weight and wellness -follow up in 1 month, consider starting ozempic for weight loss

## 2021-11-05 NOTE — Patient Instructions (Addendum)
It was great seeing you today!  Today we discussed nutrition and your mood. I want you to keep doing what you are doing, you should be very proud of yourself for not only developing healthy habits but sticking to doing them regularly.   I have placed a referral to healthy weight and wellness, if you do not hear from them within the next 2 weeks, then please contact our office so that we can try to assist with scheduling.   Below is a list of therapists, please choice one and establish care at your earliest convenience.  Please follow up at your next scheduled appointment in 1 month, if anything arises between now and then, please don't hesitate to contact our office.   Thank you for allowing [place] to be a part of your medical care!  Thank you, Dr. Robyne Peers    Therapy and Counseling Resources Most providers on this list will take Medicaid. Patients with commercial insurance or Medicare should contact their insurance company to get a list of in network providers.  [facility]:Psychiatry and Counseling [address] [facility] [address] [place] [place], [place] [address] [phone]  [facility]  [address] [place], [place] [address]      [phone]  [facility] [address]  [place], [place] [address] [phone]  [facility] [address]  [place], [place] [address]       [phone]     [facility] (virtual only) [phone]  Jovita Kussmaul [facility] [address] [place], [place] [phone]  [facility]:  [address] [place] [phone]  [facility]:  [address] [place] [phone]  [facility] (under & uninsured) [address]   [place] [phone]    [email or web address]    [facility] [address]  [place]    [phone]  [facility] -[address]     Phone:  [phone]     [facility]-  [address]  [phone]   [facility] [address]      [place], [place] [phone]  [facility]: [address] [place], [place], [place]  [phone]   [facility] (Spanish therapist) [email or web address]  [address] [place]  [address]   [place] [address]    [phone]    [facility]   [address],  [place], [place]  [phone]   [facility]  [address] [place]  [phone]  [facility]  [address] [place], [place]        [phone]  Open Access/Walk In Clinic under & uninsured  [facility]  [address] [place], [place] [phone] Crisis [phone]  [facility] [place],  (Spanish)   [address], [place]: [phone]) 8:30 - 12; 1 - 2:30  [facility] [employer],  [address] [place], [place]    ([phone]):8:30 - 12; 2 - 3PM  [facility] [employer],  [address],  [place]; [phone]):   Mon - Fri 8 AM - 5 PM  [facility] [address] [place]  MWF 12:30 to 3:00 or call to schedule an appointment  [phone]  Specific Provider options Psychology Today  [email or web address] click on find  a therapist  enter your zip code left side and select or tailor a therapist for your specific need.   [facility] Provider Directory [email or web address]  (Medicaid)   Follow all drop down to find a provider  Social Support program [facility] [place] [phone] or PhotoSolver.pl [address], [place], [place] Recovery support and educational   24- Hour Availability:   [facility]  [address] [place], [place] [phone] Crisis [phone]  [facility] [phone]  [facility]  [phone]   [facility]  [phone] (after hours)  Therapeutic Alternative/Mobile Crisis   [phone]  [place] National Suicide Hotline  [phone] Len Childs)  Call 911 or go to emergency room  [facility]  [phone]);  [place] and [employer]  [phone]); [place], [place], [place], [place], [place], [place], [place]

## 2021-11-05 NOTE — Assessment & Plan Note (Signed)
-  reassurance provided, much of patient's perspective of self is linked to weight loss, discussed the health benefits of weight loss -list of therapists provided, encouraged to choose one and establish care at earliest convenience -PHQ-9 score of 13 with negative question 9 reviewed and discussed  -follow up in 1 month for mood check

## 2022-01-17 DIAGNOSIS — Z130 Encounter for screening for diseases of the blood and blood-forming organs and certain disorders involving the immune mechanism: Secondary | ICD-10-CM | POA: Diagnosis not present

## 2022-01-17 DIAGNOSIS — Z01419 Encounter for gynecological examination (general) (routine) without abnormal findings: Secondary | ICD-10-CM | POA: Diagnosis not present

## 2022-01-17 DIAGNOSIS — Z1389 Encounter for screening for other disorder: Secondary | ICD-10-CM | POA: Diagnosis not present

## 2022-01-17 DIAGNOSIS — E669 Obesity, unspecified: Secondary | ICD-10-CM | POA: Diagnosis not present

## 2022-01-17 DIAGNOSIS — Z30431 Encounter for routine checking of intrauterine contraceptive device: Secondary | ICD-10-CM | POA: Diagnosis not present

## 2022-01-17 DIAGNOSIS — Z124 Encounter for screening for malignant neoplasm of cervix: Secondary | ICD-10-CM | POA: Diagnosis not present

## 2022-04-30 ENCOUNTER — Encounter: Payer: Self-pay | Admitting: *Deleted

## 2022-06-24 ENCOUNTER — Ambulatory Visit: Payer: 59 | Admitting: Student

## 2022-07-01 ENCOUNTER — Encounter: Payer: Self-pay | Admitting: Family Medicine

## 2022-07-01 ENCOUNTER — Ambulatory Visit (INDEPENDENT_AMBULATORY_CARE_PROVIDER_SITE_OTHER): Payer: Medicaid Other | Admitting: Family Medicine

## 2022-07-01 VITALS — BP 114/78 | HR 80 | Wt 190.0 lb

## 2022-07-01 DIAGNOSIS — Z124 Encounter for screening for malignant neoplasm of cervix: Secondary | ICD-10-CM | POA: Diagnosis not present

## 2022-07-01 DIAGNOSIS — F419 Anxiety disorder, unspecified: Secondary | ICD-10-CM

## 2022-07-01 DIAGNOSIS — G43709 Chronic migraine without aura, not intractable, without status migrainosus: Secondary | ICD-10-CM | POA: Insufficient documentation

## 2022-07-01 MED ORDER — PROPRANOLOL HCL 40 MG PO TABS: ORAL_TABLET | ORAL | 1 refills | Status: DC

## 2022-07-01 MED ORDER — SUMATRIPTAN SUCCINATE 50 MG PO TABS: 50.0000 mg | ORAL_TABLET | ORAL | 0 refills | Status: AC | PRN

## 2022-07-01 MED ORDER — HYDROXYZINE HCL 10 MG PO TABS: 10.0000 mg | ORAL_TABLET | Freq: Three times a day (TID) | ORAL | 0 refills | Status: AC | PRN

## 2022-07-01 NOTE — Assessment & Plan Note (Signed)
Given patient is 44, she is now a candidate for routine Pap smears.  Discussed with patient, asked her to schedule at her convenience

## 2022-07-01 NOTE — Assessment & Plan Note (Signed)
Request refill of hydroxyzine, tolerating well.  Used as needed sparingly.

## 2022-07-01 NOTE — Patient Instructions (Signed)
It was wonderful to see you today. Thank you for allowing me to be a part of your care. Below is a short summary of what we discussed at your visit today:  Migraine Start propanolol. This is a preventative medication. To make sure you tolerate it well, start with the 40 mg tablet twice daily. In a week, if you are tolerating fine you can increase to two tablets (80 mg) twice daily. If you like this medication and it is helping, let me know and I will write you a script for the long acting formulation that you only take once per day.   Start sumatriptan as needed. Take one tablet when you feel a migraine coming on. In two hours, you can take one additional tablet.   Health Maintenance We like to think about ways to keep you healthy for years to come. Below are some interventions and screenings we can offer to keep you healthy: - PAP smear (please schedule at your convenience) - HPV vaccines - TDaP vaccine   Please bring all of your medications to every appointment!  If you have any questions or concerns, please do not hesitate to contact us via phone or MyChart message.   Fayette Pho, MD

## 2022-07-01 NOTE — Assessment & Plan Note (Signed)
Acute worsening of chronic issue.  No red flags that indicate imaging necessary at this time.  Patient declines Toradol IM today in clinic given fear of needles.  Patient amenable to both abortive and daily medications for migraine. - Start propanolol 40 mg twice daily, titrate up to 80 mg twice daily after 1 week if tolerated - Sumatriptan 50 mg tablets - Return precautions discussed, see AVS for more

## 2022-07-01 NOTE — Progress Notes (Signed)
    SUBJECTIVE:   CHIEF COMPLAINT / HPI:   Migraines Previous history of migraines, started about 21 years old Had a brain MR 2017 - normal, unremarkable Previously had 2 or 3 in a week, rated 4 or 5/10 for pain  Worse lately in last month or two, now having 6 days/week Lasting 2 or 3 hours each No aura Tried ibuprofen and tylenol without relief Trying to wear blue light glasses, but strains eyes more Combo of 5 hour energy, benadryl, and Excedrin migraine used to help about a year ago, but hasn't helped this time   Described as left sided headache starting at forehead, radiates to eye and posterior skull Has Photo/phonophobia No nausea, blurry vision, focal deficits Triggers: light, flashing light, headlights at night, stress  Can't remember if ever tried daily medicine, does not have abortive medicine for this Could have tried propanolol, can't remember if it helped  PERTINENT  PMH / PSH:  Patient Active Problem List   Diagnosis Date Noted   Chronic migraine without aura without status migrainosus, not intractable 07/01/2022   Cervical cancer screening 07/01/2022   BMI 30.0-30.9,adult 11/05/2021   Mood changes 11/05/2021   Healthcare maintenance 01/03/2021   Anxiety 12/17/2020   Acne 06/16/2018    OBJECTIVE:   BP 114/78   Pulse 80   Wt 190 lb (86.2 kg)   LMP 05/31/2022 (Approximate)   BMI 29.76 kg/m    PHQ-9:     07/01/2022    9:01 AM 11/05/2021    1:41 PM 07/03/2021    2:05 PM  Depression screen PHQ 2/9  Decreased Interest 1 1 1   Down, Depressed, Hopeless 1 1 1   PHQ - 2 Score 2 2 2   Altered sleeping 1 1 1   Tired, decreased energy 2 1 1   Change in appetite 2 3 2   Feeling bad or failure about yourself  2 1 1   Trouble concentrating 2 3 1   Moving slowly or fidgety/restless 2 2 0  Suicidal thoughts 0 0 0  PHQ-9 Score 13 13 8   Difficult doing work/chores Somewhat difficult Very difficult Somewhat difficult    Physical Exam General: Awake, alert,  oriented HEENT: PERRL, oral mucosa pink, moist, without lesion, intact dentition without obvious cavity Cardiovascular: Regular rate and rhythm, S1 and S2 present, no murmurs auscultated Respiratory: Lung fields clear to auscultation bilaterally Neuro: Cranial nerves II through X grossly intact, able to move all extremities spontaneously  MSK: grip strength 5/5 and equal bilaterally, BUE strength 5/5 and equal, hip flexion 5/5 and equal  ASSESSMENT/PLAN:   Anxiety Request refill of hydroxyzine, tolerating well.  Used as needed sparingly.  Chronic migraine without aura without status migrainosus, not intractable Acute worsening of chronic issue.  No red flags that indicate imaging necessary at this time.  Patient declines Toradol IM today in clinic given fear of needles.  Patient amenable to both abortive and daily medications for migraine. - Start propanolol 40 mg twice daily, titrate up to 80 mg twice daily after 1 week if tolerated - Sumatriptan 50 mg tablets - Return precautions discussed, see AVS for more  Cervical cancer screening Given patient is 21, she is now a candidate for routine Pap smears.  Discussed with patient, asked her to schedule at her convenience     , MD [facility]

## 2022-07-02 ENCOUNTER — Encounter: Payer: Self-pay | Admitting: Family Medicine

## 2022-07-08 ENCOUNTER — Telehealth: Payer: Self-pay | Admitting: Family Medicine

## 2022-07-08 NOTE — Telephone Encounter (Signed)
Called patient to discuss other migraine prevention strategies.  Discussed amitriptyline, venlafaxine, and topiramate.  Patient politely declines amitriptyline and venlafaxine given previous adverse experiences with antidepressants.  She reports she does not like how they make her feel in the first couple of weeks and that the feeling does not go away with time.  Could consider topiramate or valproate versus CGRP.  Discussed with patient teratogenicity of the anticonvulsants, she reports she is not looking to have children ever and has a Mirena IUD in place currently.  Will precept with attendings and call back.   Fayette Pho, MD

## 2022-07-18 DIAGNOSIS — Z30431 Encounter for routine checking of intrauterine contraceptive device: Secondary | ICD-10-CM | POA: Diagnosis not present

## 2023-04-25 ENCOUNTER — Ambulatory Visit: Payer: Commercial Managed Care - PPO | Admitting: Family Medicine

## 2023-04-25 ENCOUNTER — Encounter: Payer: Self-pay | Admitting: Family Medicine

## 2023-04-25 VITALS — BP 128/80 | HR 80 | Ht 68.0 in | Wt 201.0 lb

## 2023-04-25 DIAGNOSIS — F419 Anxiety disorder, unspecified: Secondary | ICD-10-CM | POA: Diagnosis not present

## 2023-04-25 MED ORDER — ESCITALOPRAM OXALATE 10 MG PO TABS: 10.0000 mg | ORAL_TABLET | Freq: Every day | ORAL | 0 refills | Status: DC

## 2023-04-25 NOTE — Assessment & Plan Note (Signed)
-  PHQ-9 score of 13 with negative question 9 reviewed and discussed.  -given mildly depressive symptoms along with anxiety and prior success with lexapro in the past, restarted lexapro 10 mg daily -continue therapy, discussed that optimal benefits are due to both pharmacotherapy and behavioral therapy  -psychiatry referral placed for further mental health resources along with possible formal ADHD evaluation  -follow up in 2 weeks for mood check and to monitor progression

## 2023-04-25 NOTE — Patient Instructions (Addendum)
It was great seeing you today!  Today we discussed your mood, I have started you back on lexapro 10 mg daily. Please continue therapy as it has been shown that both medication and therapy work the best together to improve anxious and depressive symptoms. This medication can take up to 4-6 weeks to completely work.   I have also placed a referral to psychiatry, if you do not hear from them within 2 weeks then please reach out to our clinic so we can try to assist with scheduling.   Please return in 2 weeks to ensure that you are doing well on the medication!  Please follow up at your next scheduled appointment in 2 weeks, if anything arises between now and then, please don't hesitate to contact our office.   Thank you for allowing [place] to be a part of your medical care!  Thank you, Dr. Robyne Peers

## 2023-04-25 NOTE — Progress Notes (Signed)
    SUBJECTIVE:   CHIEF COMPLAINT / HPI:   Patient presents with concern of mood, she says that sometimes she has trouble focusing and that causes her to be forgetful of her daily routine activities. She feels anxious and jittery more often that is causing her to have issues with focusing. She has tried Administrator, sports and calming techniques but these have not worked. Has also tried making a list and journaling but this has also not been helpful. This has been ongoing for the last 2 months. In the past, she says she was more efficient and always had a game plan, would be easy for her to follow through with things. She admits that she gets burnt out at work but has mental health day accommodations at work that allow for her to get some rest. In the past, she has been on lexapro when she was younger and stopped on her own because she thought that she did not need it. Prescribed atarax as needed but says that she does not take this anymore. Feels like the lexapro helped her when she was on it. She is currently in therapy once a week, used to go once every 2 weeks through [facility]. Denies thoughts of harming self or others. Feels that she often does not feel motivated to do things, sometimes feels down but not depressed as often. Has not been diagnosed with ADHD in the past. Denies any personal stressors or life changes. Support system includes brother and mother.   OBJECTIVE:   BP 128/80   Pulse 80   Ht 5\' 8"  (1.727 m)   Wt 201 lb (91.2 kg)   LMP 02/24/2023 (Approximate)   SpO2 100%   BMI 30.56 kg/m   General: Patient well-appearing, in no acute distress. Resp: normal work of breathing noted Psych: mild to moderate anxious mood, appropriate affect, pleasant   ASSESSMENT/PLAN:   Anxiety -PHQ-9 score of 13 with negative question 9 reviewed and discussed.  -given mildly depressive symptoms along with anxiety and prior success with lexapro in the past, restarted lexapro 10 mg daily -continue  therapy, discussed that optimal benefits are due to both pharmacotherapy and behavioral therapy  -psychiatry referral placed for further mental health resources along with possible formal ADHD evaluation  -follow up in 2 weeks for mood check and to monitor progression   Kelaiah Escalona Robyne Peers, DO [facility]

## 2023-05-14 ENCOUNTER — Ambulatory Visit: Payer: Self-pay | Admitting: Student

## 2023-05-21 ENCOUNTER — Other Ambulatory Visit: Payer: Self-pay | Admitting: Family Medicine

## 2023-05-21 DIAGNOSIS — F419 Anxiety disorder, unspecified: Secondary | ICD-10-CM

## 2023-10-27 ENCOUNTER — Ambulatory Visit (INDEPENDENT_AMBULATORY_CARE_PROVIDER_SITE_OTHER): Payer: Commercial Managed Care - PPO | Admitting: Student

## 2023-10-27 VITALS — BP 122/74 | HR 87 | Ht 68.0 in | Wt 207.0 lb

## 2023-10-27 DIAGNOSIS — Z6830 Body mass index (BMI) 30.0-30.9, adult: Secondary | ICD-10-CM | POA: Diagnosis not present

## 2023-10-27 DIAGNOSIS — F909 Attention-deficit hyperactivity disorder, unspecified type: Secondary | ICD-10-CM | POA: Diagnosis not present

## 2023-10-27 DIAGNOSIS — R635 Abnormal weight gain: Secondary | ICD-10-CM | POA: Diagnosis not present

## 2023-10-27 DIAGNOSIS — Z1339 Encounter for screening examination for other mental health and behavioral disorders: Secondary | ICD-10-CM

## 2023-10-27 LAB — POCT GLYCOSYLATED HEMOGLOBIN (HGB A1C): Hemoglobin A1C: 5.6 % (ref 4.0–5.6)

## 2023-10-27 NOTE — Patient Instructions (Addendum)
It was great seeing you today.  As we discussed, -We are checking blood work today (thyroid, A1c for diabetes).  Pending these results, I will send Wegovy/Ozempic to the pharmacy. -Continue working on eating well, exercising 3-4 times per week.   If you have any questions or concerns, please feel free to call the clinic.   Have a wonderful day,  Dr. Darral Dash [facility] [phone]

## 2023-10-27 NOTE — Progress Notes (Signed)
    SUBJECTIVE:   CHIEF COMPLAINT / HPI:   Jasmine Kennedy is a 22 year-old female here to discuss weight management and prior referral to psychiatry for ADHD evaluation.  She says she never heard from the psychiatry office regarding establishing care for ADHD evaluation.  It appears that they tried to get in talk contact with her x 2, and left a voicemail but were unable to schedule with her.  Regarding low weight, she says she has been trying to lose weight for the past 2-1/2 years. Reports a fairly healthy diet of high-protein, vegetables. 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 She says that diabetes runs in her family and her mother and maternal grandmother. Denies any fatigue, sluggishness, cold or heat intolerance.  PERTINENT  PMH / PSH:  Chronic migraine without aura Anxiety Obesity  OBJECTIVE:   BP 122/74   Pulse 87   Ht 5\' 8"  (1.727 m)   Wt 207 lb (93.9 kg)   SpO2 98%   BMI 31.47 kg/m   General: Pleasant and well-appearing, well-nourished HEENT: Mucous membranes moist.  No palpable thyroid abnormalities. Cardiac: Regular rate and rhythm Respiratory: Normal work of breathing on room air with no wheezing or crackles Extremities: Warm and well-perfused, no edema    ASSESSMENT/PLAN:   BMI 30.0-30.9,adult Encouraged patient to continue eating healthy foods, but to be aware of caloric intake. -TSH, A1c to rule out thyroid disorder or diabetes mellitus -Pending these results, we will send in semaglutide 0.25 mg to the pharmacy, I am hopeful that she will have insurance coverage for this especially given her family history of diabetes.   ADHD (attention deficit hyperactivity disorder) evaluation Referral was resent to psychiatry today, per patient request.     Darral Dash, DO [facility]

## 2023-10-28 LAB — TSH RFX ON ABNORMAL TO FREE T4: TSH: 1.56 u[IU]/mL (ref 0.450–4.500)

## 2023-10-29 ENCOUNTER — Encounter: Payer: Self-pay | Admitting: Student

## 2023-10-30 DIAGNOSIS — Z1339 Encounter for screening examination for other mental health and behavioral disorders: Secondary | ICD-10-CM | POA: Insufficient documentation

## 2023-10-30 MED ORDER — WEGOVY 0.25 MG/0.5ML ~~LOC~~ SOAJ: 0.2500 mg | SUBCUTANEOUS | 0 refills | Status: DC

## 2023-10-30 NOTE — Assessment & Plan Note (Signed)
Encouraged patient to continue eating healthy foods, but to be aware of caloric intake. -TSH, A1c to rule out thyroid disorder or diabetes mellitus -Pending these results, we will send in semaglutide 0.25 mg to the pharmacy, I am hopeful that she will have insurance coverage for this especially given her family history of diabetes.

## 2023-10-30 NOTE — Assessment & Plan Note (Addendum)
Referral was resent to psychiatry today, per patient request.

## 2023-11-03 ENCOUNTER — Other Ambulatory Visit (HOSPITAL_COMMUNITY): Payer: Self-pay

## 2023-11-03 ENCOUNTER — Encounter: Payer: Self-pay | Admitting: Student

## 2023-11-04 ENCOUNTER — Other Ambulatory Visit (HOSPITAL_COMMUNITY): Payer: Self-pay

## 2023-11-05 ENCOUNTER — Telehealth: Payer: Self-pay

## 2023-11-05 ENCOUNTER — Other Ambulatory Visit (HOSPITAL_COMMUNITY): Payer: Self-pay

## 2023-11-05 NOTE — Telephone Encounter (Signed)
Pharmacy Patient Advocate Encounter   Received notification from Patient Advice Request messages that prior authorization for Jasmine Kennedy is required/requested.   Insurance verification completed.   The patient is insured through [facility] .   PA required; PA submitted to above mentioned insurance via CoverMyMeds Key/confirmation #/EOC Jasmine Kennedy. Status is pending

## 2023-11-05 NOTE — Telephone Encounter (Signed)
Pharmacy Patient Advocate Encounter  Received notification from [facility] that Prior Authorization for [facility] has been DENIED.  Full denial letter will be uploaded to the media tab. See denial reason below.  The requested medication is not covered by the plan. Medications used for weight loss are not covered under your health plan's pharmacy coverage   Will attempt to not submit PA to managed medicaid.

## 2023-11-06 ENCOUNTER — Other Ambulatory Visit (HOSPITAL_COMMUNITY): Payer: Self-pay

## 2023-11-10 NOTE — Telephone Encounter (Signed)
Reached out to pharmacy regarding the denial of Wegovy on patients primary insurance and suggested they attempt to process claim again by skipping the primary coverage and sending [place] the PA under managed medicaid.   PA rec'd (key [id number]). Attempted to submit PA but deemed NA. Medicaid wants patients primary coverage to be billed first. Will reach out to [facility] for next options.

## 2023-11-12 ENCOUNTER — Other Ambulatory Visit (HOSPITAL_COMMUNITY): Payer: Self-pay

## 2023-11-12 NOTE — Telephone Encounter (Signed)
Pharmacy Patient Advocate Encounter  Received notification from [facility] that Prior Authorization for [facility] 0.25MG  has been APPROVED from 11/12/23 to 05/10/24

## 2023-11-12 NOTE — Telephone Encounter (Signed)
New PA created verbally with [employer] ([place]).   PA submitted; ref #[id number]  Should receive a response via fax.

## 2023-11-28 ENCOUNTER — Encounter: Payer: Self-pay | Admitting: Student

## 2023-11-28 ENCOUNTER — Other Ambulatory Visit: Payer: Self-pay | Admitting: Student

## 2023-11-28 MED ORDER — WEGOVY 0.5 MG/0.5ML ~~LOC~~ SOAJ: 0.5000 mg | SUBCUTANEOUS | 0 refills | Status: DC

## 2023-11-28 NOTE — Progress Notes (Signed)
 Wegovy 0.5 mg weekly subcutaneous sent to pharmacy.

## 2023-12-09 ENCOUNTER — Ambulatory Visit (INDEPENDENT_AMBULATORY_CARE_PROVIDER_SITE_OTHER): Payer: Commercial Managed Care - PPO | Admitting: Student

## 2023-12-09 VITALS — BP 110/80 | HR 110 | Temp 97.6°F | Ht 67.0 in | Wt 196.2 lb

## 2023-12-09 DIAGNOSIS — Z6830 Body mass index (BMI) 30.0-30.9, adult: Secondary | ICD-10-CM

## 2023-12-09 DIAGNOSIS — Z0000 Encounter for general adult medical examination without abnormal findings: Secondary | ICD-10-CM | POA: Diagnosis not present

## 2023-12-09 DIAGNOSIS — Z23 Encounter for immunization: Secondary | ICD-10-CM | POA: Diagnosis not present

## 2023-12-09 NOTE — Assessment & Plan Note (Signed)
 Doing well on Wegovy 0.5 mg subcutaneous weekly with ~ 10 lb weight loss. Continue current dose.

## 2023-12-09 NOTE — Progress Notes (Signed)
    SUBJECTIVE:   Chief compliant/HPI: annual examination  Jasmine Kennedy is a 23 y.o. who presents today for an annual exam.  She is to start phlebotomy school soon. She needs Quant gold test and vaccinations. 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 No significant adverse effects. Has lost 10.8 lb since 10/27/2023. Has been walking and going to the gym.  Review of systems form notable for none.   Updated history tabs and problem list .   OBJECTIVE:   BP 110/80   Pulse (!) 110   Temp 97.6 F (36.4 C)   Ht 5' 7 (1.702 m)   Wt 196 lb 3.2 oz (89 kg)   SpO2 99%   BMI 30.73 kg/m   General: NAD, well appearing Cardiac: RRR Neuro: A&O Respiratory: normal WOB on RA. No wheezing or crackles on auscultation, good lung sounds throughout Extremities: Moving all 4 extremities equally   ASSESSMENT/PLAN:   BMI 30.0-30.9,adult Doing well on Wegovy  0.5 mg subcutaneous weekly with ~ 10 lb weight loss. Continue current dose.  Annual Examination  See AVS for age appropriate recommendations.   PHQ score 8, reviewed and discussed. Blood pressure reviewed and at goal.  Asked about intimate partner violence and patient reports feeling safe.  The patient currently uses IUD for contraception. Folate recommended as appropriate, minimum of 400 mcg per day.   Considered the following items based upon USPSTF recommendations: HIV testing: discussed and ordered Hepatitis C: ordered GC/CT  declined today   Cervical cancer screening: discussed and declined.  Will return for visit to get this completed Immunizations - Varicella, Tdap, Flu administered today,  Follow up in 3-6 months for weight check.    Angelos Wasco, DO [facility]

## 2023-12-09 NOTE — Patient Instructions (Signed)
 It was great seeing you today.  As we discussed, - No changes to your medications today. - You can call to schedule your Pap smear (cervical cancer screening) with our clinic or an OBGYN.  - Good luck in school! You will do great. - Schedule a follow up for weight check/Wegovy  check in in 1-2 months.   If you have any questions or concerns, please feel free to call the clinic.   Have a wonderful day,  Dr. Barabara Dama [facility] [phone]

## 2023-12-13 LAB — HCV INTERPRETATION

## 2023-12-13 LAB — QUANTIFERON-TB GOLD PLUS
QuantiFERON Mitogen Value: >10 [IU]/mL
QuantiFERON Nil Value: 0 [IU]/mL
QuantiFERON TB1 Ag Value: 0 [IU]/mL
QuantiFERON TB2 Ag Value: 0 [IU]/mL
QuantiFERON-TB Gold Plus: NEGATIVE

## 2023-12-13 LAB — HIV ANTIBODY (ROUTINE TESTING W REFLEX): HIV Screen 4th Generation wRfx: NONREACTIVE

## 2023-12-13 LAB — HCV AB W REFLEX TO QUANT PCR: HCV Ab: NONREACTIVE

## 2023-12-13 LAB — HEMOGLOBIN A1C
Est. average glucose Bld gHb Est-mCnc: 97 mg/dL
Hgb A1c MFr Bld: 5 % (ref 4.8–5.6)

## 2023-12-28 ENCOUNTER — Encounter: Payer: Self-pay | Admitting: Student

## 2024-01-04 ENCOUNTER — Other Ambulatory Visit: Payer: Self-pay | Admitting: Student

## 2024-01-04 MED ORDER — ONDANSETRON HCL 4 MG PO TABS: 4.0000 mg | ORAL_TABLET | Freq: Three times a day (TID) | ORAL | 0 refills | Status: AC | PRN

## 2024-02-04 ENCOUNTER — Encounter: Payer: Self-pay | Admitting: Student

## 2024-02-04 ENCOUNTER — Ambulatory Visit (INDEPENDENT_AMBULATORY_CARE_PROVIDER_SITE_OTHER): Payer: PRIVATE HEALTH INSURANCE | Admitting: Student

## 2024-02-04 VITALS — BP 119/80 | HR 87 | Ht 67.0 in | Wt 181.6 lb

## 2024-02-04 DIAGNOSIS — Z6830 Body mass index (BMI) 30.0-30.9, adult: Secondary | ICD-10-CM | POA: Diagnosis not present

## 2024-02-04 DIAGNOSIS — R634 Abnormal weight loss: Secondary | ICD-10-CM

## 2024-02-04 MED ORDER — WEGOVY 1 MG/0.5ML ~~LOC~~ SOAJ: 1.0000 mg | SUBCUTANEOUS | 1 refills | Status: DC

## 2024-02-04 NOTE — Assessment & Plan Note (Signed)
 Appropriate weight loss on Wegovy 0.5 mg, down 30 lbs since initiation of medication. Discussed potential increased nausea with 1 mg dose and importance of hydration and constipation management. - Increase Wegovy dose to 1 mg. - Advise hydration and electrolyte intake. - Start MiraLAX one capful daily, increase to two if no bowel improvement after three days. - Monitor for increased side effects, consider reverting to 0.5 mg if intolerable.

## 2024-02-04 NOTE — Patient Instructions (Addendum)
 It was great seeing you today.  As we discussed, - Increase Wegovy to 1.0 mg weekly. You may have increased nausea or constipation. Drink plenty of fluids, 1 capful miralax per day- may increase to 2 capfuls if not having regular, soft bowel movement within 3 days - Bloodwork today, will call if anything is abnormal. - Will see you in 2 months for check in    If you have any questions or concerns, please feel free to call the clinic.   Have a wonderful day,  Dr. Darral Dash [facility] [phone]

## 2024-02-04 NOTE — Progress Notes (Signed)
    SUBJECTIVE:   CHIEF COMPLAINT / HPI:   Has lost 26 lbs since October 27, 2023 when [facility] was initiated. Weight today is 181.6, weight at initiation of Wegovy was 207 lbs.  She is currently on a dose of 0.5mg  and is tolerating the medication well, with the exception of transient nausea lasting for about thirty minutes on Sundays. The patient takes the medication around ten o'clock in the morning and experiences the nausea around three o'clock in the afternoon. She has also noticed increased difficulty with bowel movements since starting the medication. 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  PERTINENT  PMH / PSH:  Obesity ADHD  OBJECTIVE:   BP 119/80   Pulse 87   Ht 5\' 7"  (1.702 m)   Wt 181 lb 9.6 oz (82.4 kg)   SpO2 100%   BMI 28.44 kg/m   General: NAD, well appearing Cardiac: RRR Neuro: A&O Respiratory: normal WOB on RA. No wheezing or crackles on auscultation, good lung sounds throughout Extremities: Moving all 4 extremities equally   ASSESSMENT/PLAN:   BMI 30.0-30.9,adult Appropriate weight loss on Wegovy 0.5 mg, down 30 lbs since initiation of medication. Discussed potential increased nausea with 1 mg dose and importance of hydration and constipation management. - Increase Wegovy dose to 1 mg. - Advise hydration and electrolyte intake. - Start MiraLAX one capful daily, increase to two if no bowel improvement after three days. - Monitor for increased side effects, consider reverting to 0.5 mg if intolerable.     Darral Dash, DO [facility]

## 2024-02-05 LAB — COMPREHENSIVE METABOLIC PANEL
ALT: 22 IU/L (ref 0–32)
AST: 19 IU/L (ref 0–40)
Albumin: 4.5 g/dL (ref 4.0–5.0)
Alkaline Phosphatase: 84 IU/L (ref 44–121)
BUN/Creatinine Ratio: 11 (ref 9–23)
BUN: 8 mg/dL (ref 6–20)
Bilirubin Total: 0.5 mg/dL (ref 0.0–1.2)
CO2: 22 mmol/L (ref 20–29)
Calcium: 9.6 mg/dL (ref 8.7–10.2)
Chloride: 103 mmol/L (ref 96–106)
Creatinine, Ser: 0.72 mg/dL (ref 0.57–1.00)
Globulin, Total: 2.5 g/dL (ref 1.5–4.5)
Glucose: 74 mg/dL (ref 70–99)
Potassium: 4.3 mmol/L (ref 3.5–5.2)
Sodium: 139 mmol/L (ref 134–144)
Total Protein: 7 g/dL (ref 6.0–8.5)
eGFR: 120 mL/min/{1.73_m2} (ref >59)

## 2024-03-02 ENCOUNTER — Encounter: Payer: Self-pay | Admitting: Student

## 2024-03-03 ENCOUNTER — Other Ambulatory Visit: Payer: Self-pay | Admitting: Student

## 2024-03-03 MED ORDER — WEGOVY 1 MG/0.5ML ~~LOC~~ SOAJ: 1.0000 mg | SUBCUTANEOUS | 3 refills | Status: DC

## 2024-03-25 ENCOUNTER — Encounter: Payer: Self-pay | Admitting: Student

## 2024-04-02 ENCOUNTER — Other Ambulatory Visit: Payer: Self-pay | Admitting: Student

## 2024-04-02 MED ORDER — WEGOVY 1 MG/0.5ML ~~LOC~~ SOAJ: 1.0000 mg | SUBCUTANEOUS | 3 refills | Status: AC

## 2024-04-20 ENCOUNTER — Ambulatory Visit (INDEPENDENT_AMBULATORY_CARE_PROVIDER_SITE_OTHER): Admitting: Family Medicine

## 2024-04-20 VITALS — BP 132/87 | HR 111 | Ht 67.0 in | Wt 162.6 lb

## 2024-04-20 DIAGNOSIS — W57XXXA Bitten or stung by nonvenomous insect and other nonvenomous arthropods, initial encounter: Secondary | ICD-10-CM | POA: Diagnosis not present

## 2024-04-20 DIAGNOSIS — N61 Mastitis without abscess: Secondary | ICD-10-CM

## 2024-04-20 MED ORDER — CEFADROXIL 500 MG PO CAPS
500.0000 mg | ORAL_CAPSULE | Freq: Two times a day (BID) | ORAL | 0 refills | Status: DC
Start: 2024-04-20 — End: 2024-04-28

## 2024-04-20 NOTE — Progress Notes (Addendum)
    SUBJECTIVE:   CHIEF COMPLAINT / HPI:   Right breast pain Began over the weekend.  Notes she felt something itchy in her shirt and she was scratching around her right breast.  Reports the pain and tenderness has gotten worse since that time.  Noticed this morning that she now has redness below her nipple.  Using a towel to hold her breast up as the gravity hurts the area.  Unable to wear a bra.  Reports some fevers/chills last night.  1 prior similar event many years ago that resolved on its own.  Tick exposure Reports she noticed a tick on her back on 5/20, reports it was removed.  Unclear how long the tick had been on her skin but states it was not engorged and otherwise pretty small.  Fever/chills as above but denies any other symptoms including abnormal rash or joint pains.  PERTINENT  PMH / PSH: Migraine, acne, ADHD  OBJECTIVE:   BP 132/87   Pulse (!) 120   Ht 5\' 7"  (1.702 m)   Wt 162 lb 9.6 oz (73.8 kg)   SpO2 100%   BMI 25.47 kg/m    General: NAD, pleasant, able to participate in exam Skin:  Upper back: Pinpoint area of scabbing on upper central back with minimal erythema around it. Right breast: Area of erythema, edema and some induration between the 4 and 8:00 positions of the right breast.  No nipple discharge expressed.  No palpable axillary lymphadenopathy.  No fluctuance noted.  SEE MEDIA FOR IMAGE  ASSESSMENT/PLAN:   Assessment & Plan Cellulitis of breast Exam consistent with cellulitis, no fluctuance on exam concerning for breast abscess.  Possibly originated from skin breakdown from scratching, no other risk factors such as breast-feeding.  Given acute onset lower concern for insidious etiology such as inflammatory breast cancer but if not improving on antibiotic therapy will consider referral for further imaging and evaluation. -Cefadroxil 500 mg twice daily x 10 days -F/u in 2 weeks if not resolved Tick bite, unspecified site, initial encounter Lower  concern for tickborne illness but given systemic fever/chills will obtain Lyme titers to rule out underlying infection.  >72 hours out from tick removal, out of the window for doxycycline  prophylaxis. -Lyme titers     Dr. Jonne Netters, DO [facility]

## 2024-04-20 NOTE — Patient Instructions (Signed)
 It was wonderful to see you today! Thank you for choosing [facility].   Please bring ALL of your medications with you to every visit.   Today we talked about:  I am giving you some antibiotics to treat the skin infection on your breast.  Please take the antibiotics cefadroxil twice per day for the next 10 days.  As we discussed if your symptoms are not getting better or getting worse please come back to see me sooner than later as you may need evaluation by breast surgeon for secondary causes of your symptoms. For your tick exposure I am going to check some lab work to see if you have had any exposure to Lyme disease.  If you truly were exposed you would need a much longer duration of antibiotics for 4 weeks.  If this is negative then we will just continue on your current antibiotic course.  Please follow up in 2 weeks if symptoms still present   We are checking some labs today. If they are abnormal, I will call you. If they are normal, I will send you a MyChart message (if it is active) or a letter in the mail. If you do not hear about your labs in the next 2 weeks, please call the office.  Call the clinic at [phone] if your symptoms worsen or you have any concerns.  Please be sure to schedule follow up at the front desk before you leave today.   Jonne Netters, DO Family Medicine

## 2024-04-21 ENCOUNTER — Ambulatory Visit: Payer: Self-pay | Admitting: Family Medicine

## 2024-04-21 LAB — LYME DISEASE SEROLOGY W/REFLEX: Lyme Total Antibody EIA: NEGATIVE

## 2024-04-26 ENCOUNTER — Ambulatory Visit (INDEPENDENT_AMBULATORY_CARE_PROVIDER_SITE_OTHER): Admitting: Family Medicine

## 2024-04-26 ENCOUNTER — Encounter: Payer: Self-pay | Admitting: Family Medicine

## 2024-04-26 ENCOUNTER — Other Ambulatory Visit: Payer: Self-pay | Admitting: Family Medicine

## 2024-04-26 VITALS — BP 114/77 | HR 88 | Ht 67.0 in | Wt 162.1 lb

## 2024-04-26 DIAGNOSIS — N644 Mastodynia: Secondary | ICD-10-CM

## 2024-04-26 NOTE — Patient Instructions (Signed)
 It was wonderful to see you today! Thank you for choosing [facility].   Please bring ALL of your medications with you to every visit.   Today we talked about:  I placed an urgent referral to the breast surgeon for further evaluation of the redness and swelling.  I also messaged the referral coordinator so we should have you an appointment within the week to be seen.  I also ordered a stat breast ultrasound to be done as soon as possible.  Please continue to take the antibiotics and if you start to develop worsening fever and ill feeling please return to care.  Please follow up as needed for persistent symptoms and to have Pap smear done when symptomatically improved.  Call the clinic at [phone] if your symptoms worsen or you have any concerns.  Please be sure to schedule follow up at the front desk before you leave today.   Jonne Netters, DO Family Medicine

## 2024-04-26 NOTE — Progress Notes (Signed)
    SUBJECTIVE:   CHIEF COMPLAINT / HPI:   Right breast cellulitis Seen on 04/20/2024 and diagnosed with cellulitis, given cefadroxil  twice daily x 10-day course.  Since that time reports her symptoms have continued to worsen.  She is still having persistent pain and swelling in the right breast.  Reports she has been taking the antibiotics as prescribed, maybe has had mild improvement since starting them.  Last felt feverish on Wednesday last week.  Did have episode of vomiting after initially taking antibiotics but none since that time.  PERTINENT  PMH / PSH: Migraine, acne, ADHD  OBJECTIVE:   BP 114/77   Pulse 88   Ht 5\' 7"  (1.702 m)   Wt 162 lb 2 oz (73.5 kg)   SpO2 100%   BMI 25.39 kg/m    General: NAD, pleasant, able to participate in exam Skin: Right breast: Area of erythema, edema and significant fullness below the nipple that extends between the 4:00 and 8:00 positions.  No nipple discharge.  No axillary lymphadenopathy.  See media tab for additional images.  ASSESSMENT/PLAN:   Assessment & Plan Breast pain, right Clinically worsening despite antibiotics for possible breast cellulitis, now concerning for underlying inflamed cyst versus abscess.  Given acute onset less likely inflammatory malignancy cannot be excluded.  Will obtain stat imaging and refer urgently to breast surgeon. -Stat breast ultrasound -Referral to general surgery, message referral coordinator to schedule within the week if possible   Dr. Jonne Netters, DO [facility]

## 2024-04-27 ENCOUNTER — Ambulatory Visit
Admission: RE | Admit: 2024-04-27 | Discharge: 2024-04-27 | Disposition: A | Discharge status: Home / Self Care | Source: Ambulatory Visit | Attending: Family Medicine | Admitting: Family Medicine

## 2024-04-27 ENCOUNTER — Other Ambulatory Visit: Payer: Self-pay | Admitting: Family Medicine

## 2024-04-27 ENCOUNTER — Encounter: Payer: Self-pay | Admitting: Family Medicine

## 2024-04-27 DIAGNOSIS — N61 Mastitis without abscess: Secondary | ICD-10-CM

## 2024-04-27 DIAGNOSIS — N644 Mastodynia: Secondary | ICD-10-CM | POA: Diagnosis not present

## 2024-04-28 MED ORDER — CEFADROXIL 500 MG PO CAPS: 500.0000 mg | ORAL_CAPSULE | Freq: Two times a day (BID) | ORAL | 0 refills | Status: AC

## 2024-05-04 ENCOUNTER — Encounter: Payer: Self-pay | Admitting: *Deleted

## 2024-05-11 ENCOUNTER — Ambulatory Visit (INDEPENDENT_AMBULATORY_CARE_PROVIDER_SITE_OTHER): Admitting: Student

## 2024-05-11 VITALS — BP 112/70 | HR 63 | Temp 97.5°F | Ht 67.0 in | Wt 156.2 lb

## 2024-05-11 DIAGNOSIS — R112 Nausea with vomiting, unspecified: Secondary | ICD-10-CM | POA: Diagnosis not present

## 2024-05-11 DIAGNOSIS — R1013 Epigastric pain: Secondary | ICD-10-CM | POA: Diagnosis not present

## 2024-05-11 MED ORDER — KETOROLAC TROMETHAMINE 30 MG/ML IJ SOLN
30.0000 mg | Freq: Once | INTRAMUSCULAR | Status: AC
  Administered 2024-05-11: 30 mg via INTRAMUSCULAR

## 2024-05-11 MED ORDER — ONDANSETRON 4 MG PO TBDP
4.0000 mg | ORAL_TABLET | Freq: Once | ORAL | Status: AC
  Administered 2024-05-11: 4 mg via ORAL

## 2024-05-11 NOTE — Patient Instructions (Signed)
 I am so sorry you aren't feeling well.  We gave you anti nausea medicine (Zofran ) and pain medicine (Toradol) in the clinic.  Please go to the nearest Emergency Room (either [facility] or [facility]) for labwork and possible CT scan because I am worried you may have pancreatitis.  Dr. Vallorie Gayer [facility] [phone]

## 2024-05-11 NOTE — Progress Notes (Signed)
    SUBJECTIVE:   CHIEF COMPLAINT / HPI:   Discussed the use of AI scribe software for clinical note transcription with the patient, who gave verbal consent to proceed.  History of Present Illness   Jasmine Kennedy is a 23 year old female who presents with sudden onset abdominal pain and vomiting.  She experiences severe, persistent abdominal pain since 6:50 AM, centrally located and radiating to the upper back. Vomiting began with the pain, initially with food content, now bile, without blood. No medication taken for nausea. No fever, respiratory symptoms, or diarrhea. No known sick contacts. Similar pain occurred a month and a half ago, resolving quickly, and another episode last week lasting 30 minutes, waking her from sleep. She is on Wegovy .      PERTINENT  PMH / PSH: Recent cellulitis of breast, Hx obesity on GLP-1  OBJECTIVE:   BP 112/70   Pulse 63   Temp (!) 97.5 F (36.4 C)   Ht 5' 7 (1.702 m)   Wt 156 lb 3.2 oz (70.9 kg)   SpO2 97%   BMI 24.46 kg/m   General: Mild distress, uncomfortable appearing Cardiac: RRR Neuro: A&O Respiratory: normal WOB on RA. No wheezing or crackles on auscultation, good lung sounds throughout Abdomen: Significant tenderness in epigastric region.  Extremities: Moving all 4 extremities equally   ASSESSMENT/PLAN:       Abdominal Pain with Vomiting Acute severe central abdominal pain radiating to the upper back with vomiting. Most concerning differential includes pancreatitis due to Wegovy  use. Gastroenteritis less likely. - Administered Zofran  ODT 4 mg x1 for nausea. - Referred to emergency room for evaluation, blood work, and possible abdominal CT to rule out pancreatitis. - Provided Toradol 30 mg x1 in clinic       Vallorie Gayer, DO [facility]

## 2024-05-31 ENCOUNTER — Telehealth: Payer: Self-pay

## 2024-05-31 NOTE — Telephone Encounter (Signed)
 Pharmacy Patient Advocate Encounter   Received notification from CoverMyMeds that prior authorization for WEGOVY  1MG  is required/requested.   Insurance verification completed.   The patient is insured through [facility] .   PA required; PA submitted to above mentioned insurance via CoverMyMeds Key/confirmation #/EOC [id number]. Status is pending

## 2024-05-31 NOTE — Telephone Encounter (Signed)
 Pharmacy Patient Advocate Encounter  Received notification from [facility] that Prior Authorization for WEGOVY  1MG  has been APPROVED from 05/31/24 to 05/31/25   PA #/Case ID/Reference #: [id number]

## 2024-06-06 DIAGNOSIS — R1013 Epigastric pain: Secondary | ICD-10-CM | POA: Diagnosis not present

## 2024-06-06 DIAGNOSIS — K8020 Calculus of gallbladder without cholecystitis without obstruction: Secondary | ICD-10-CM | POA: Diagnosis not present

## 2024-06-08 DIAGNOSIS — K8010 Calculus of gallbladder with chronic cholecystitis without obstruction: Secondary | ICD-10-CM | POA: Diagnosis not present

## 2024-06-08 DIAGNOSIS — K8050 Calculus of bile duct without cholangitis or cholecystitis without obstruction: Secondary | ICD-10-CM | POA: Diagnosis not present

## 2024-06-10 DIAGNOSIS — K8010 Calculus of gallbladder with chronic cholecystitis without obstruction: Secondary | ICD-10-CM | POA: Diagnosis not present

## 2024-06-10 DIAGNOSIS — Z79899 Other long term (current) drug therapy: Secondary | ICD-10-CM | POA: Diagnosis not present

## 2024-06-10 DIAGNOSIS — F419 Anxiety disorder, unspecified: Secondary | ICD-10-CM | POA: Diagnosis not present

## 2024-06-12 DIAGNOSIS — K8010 Calculus of gallbladder with chronic cholecystitis without obstruction: Secondary | ICD-10-CM | POA: Diagnosis not present

## 2024-07-01 ENCOUNTER — Other Ambulatory Visit (HOSPITAL_COMMUNITY): Payer: Self-pay

## 2024-10-17 ENCOUNTER — Other Ambulatory Visit: Payer: Self-pay | Admitting: Family Medicine
